# Patient Record
Sex: Female | Born: 1970
Health system: Southern US, Community
[De-identification: ages and names within clinical notes are randomized; demographics above are authoritative.]

## PROBLEM LIST (undated history)

## (undated) DIAGNOSIS — E119 Type 2 diabetes mellitus without complications: Secondary | ICD-10-CM

## (undated) DIAGNOSIS — I10 Essential (primary) hypertension: Secondary | ICD-10-CM

## (undated) DIAGNOSIS — G629 Polyneuropathy, unspecified: Secondary | ICD-10-CM

## (undated) HISTORY — PX: CERVICAL SPINE SURGERY: SHX589

---
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
## 2010-02-15 ENCOUNTER — Ambulatory Visit: Payer: Self-pay | Admitting: Internal Medicine

## 2010-03-17 ENCOUNTER — Inpatient Hospital Stay: Payer: Self-pay | Admitting: Internal Medicine

## 2010-03-17 IMAGING — US ABDOMEN ULTRASOUND
1 series · 17 of 25 positions shown · non-contrast
Comparison: none

REASON FOR EXAM: Low Platelets with Abd pain
COMMENTS:   May transport without cardiac monitor

[Series 1: abdomen ultrasound · 17 of 44 slices shown]
[im 1/44]
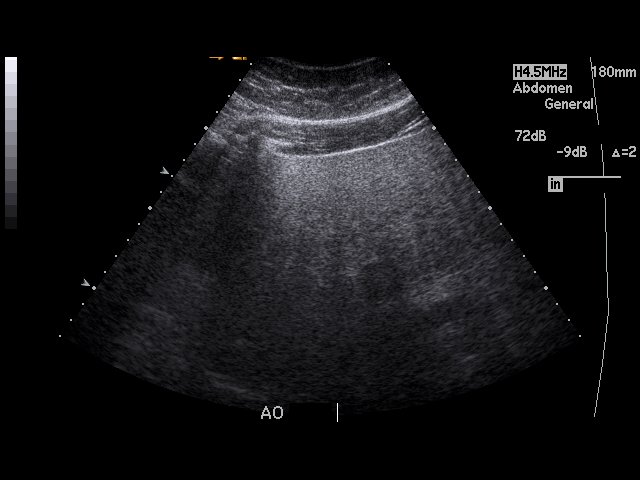
[im 4/44]
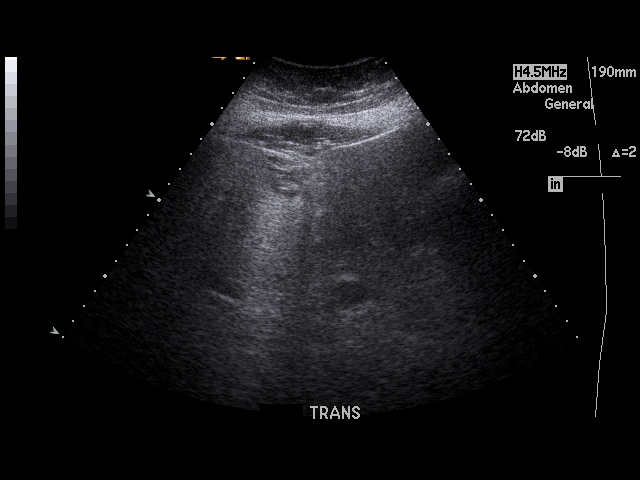
[im 6/44]
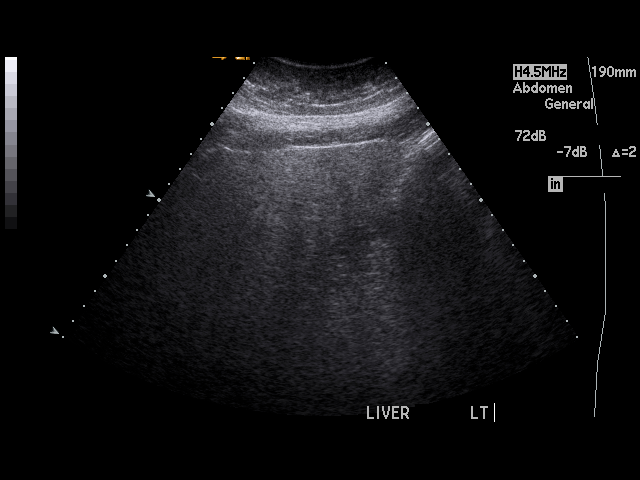
[im 9/44]
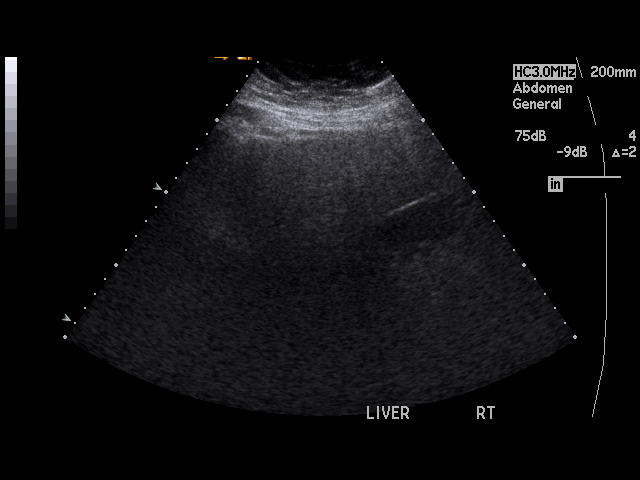
[im 11/44]
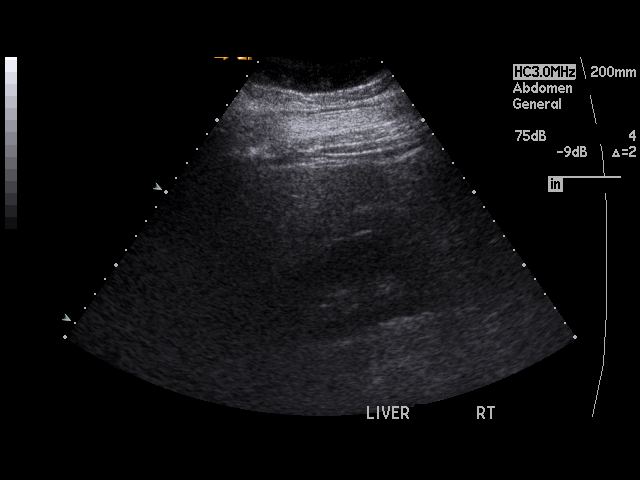
[im 15/44]
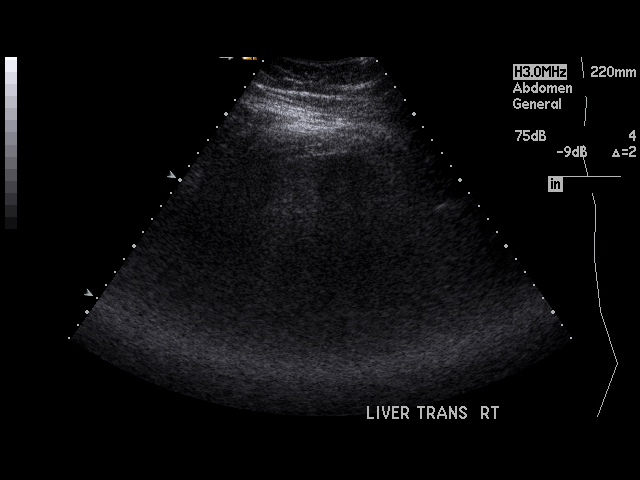
[im 17/44]
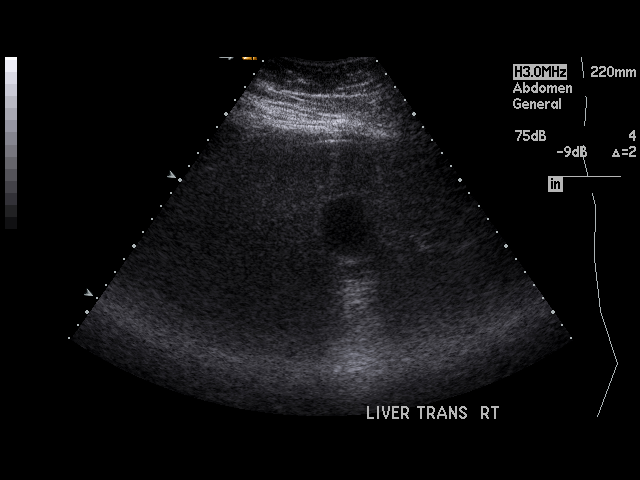
[im 20/44]
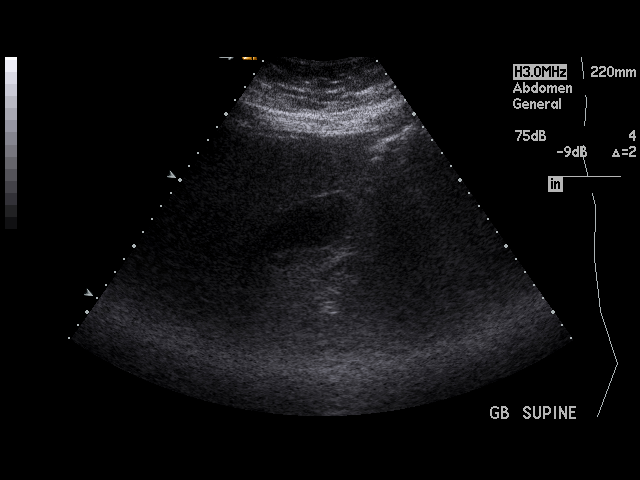
[im 22/44]
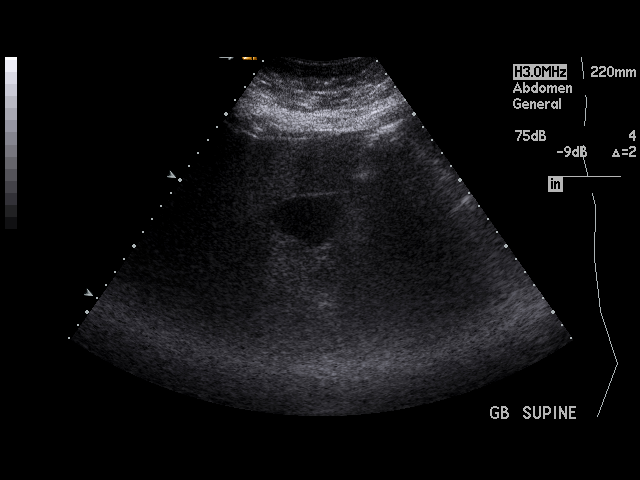
[im 24/44]
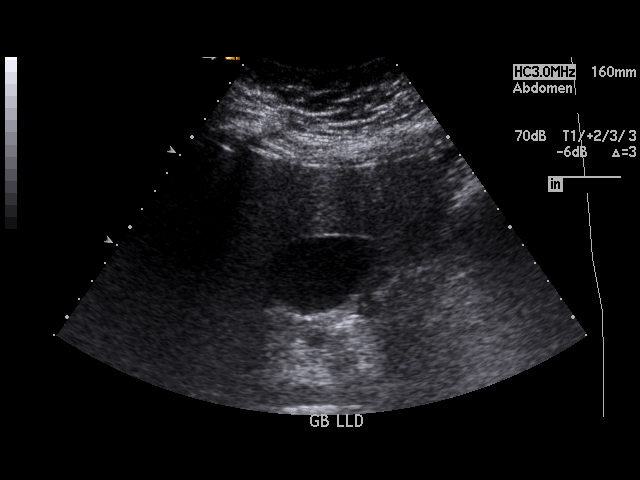
[im 27/44]
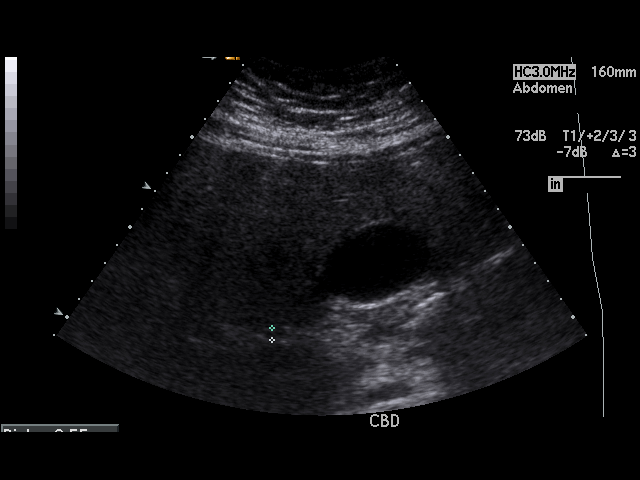
[im 29/44]
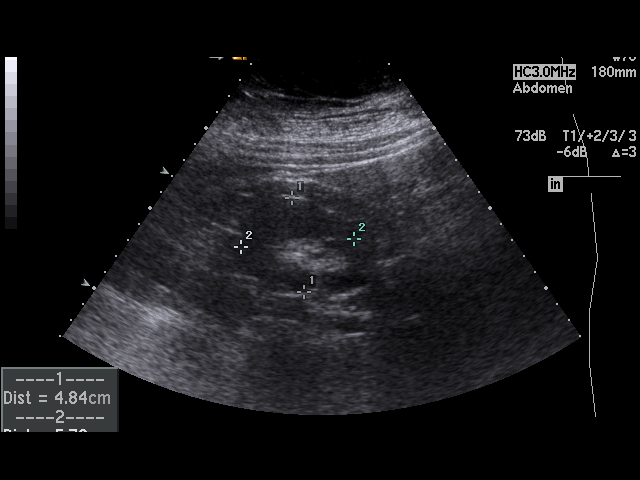
[im 33/44]
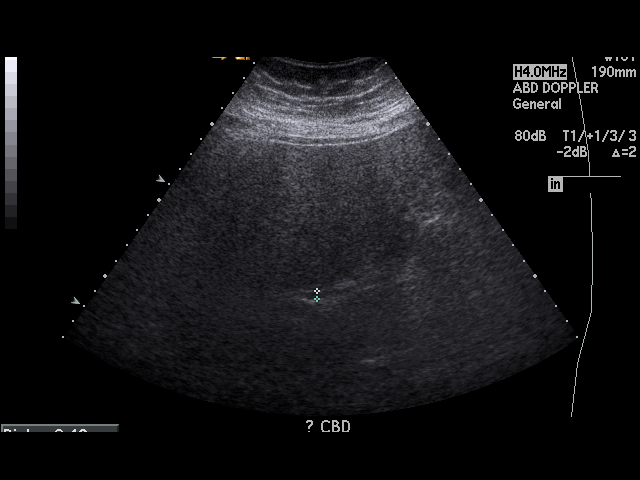
[im 35/44]
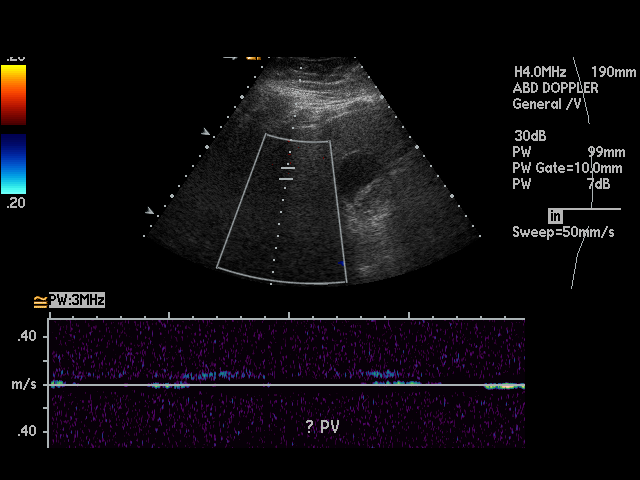
[im 38/44]
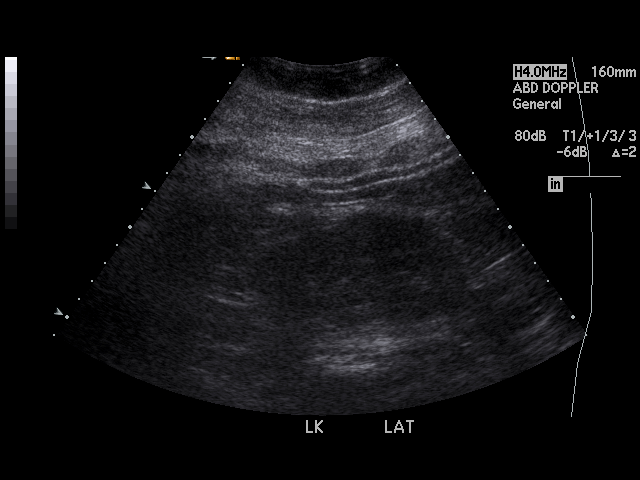
[im 40/44]
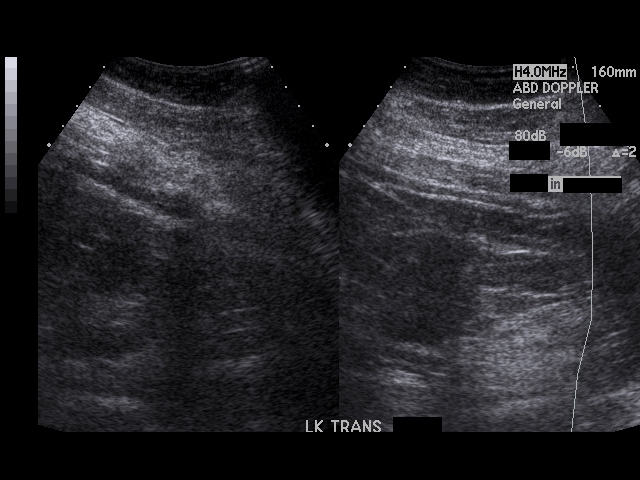
[im 44/44]
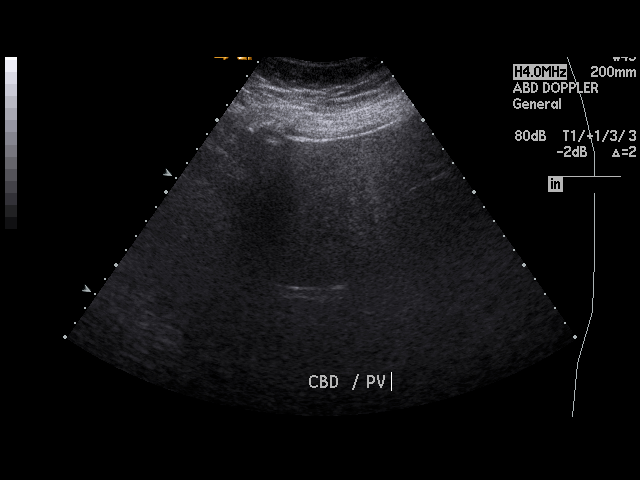

[17 of 25 positions shown; findings below may reference images not displayed]

PROCEDURE:     US  - US ABDOMEN GENERAL SURVEY  - [DATE]  [DATE]

RESULT:     Comparison: None.

Technique and Findings: Multiple grayscale and color Doppler images were
obtained of the abdomen.

The liver is diffusely echogenic and very dense, which likely represents
hepatic steatosis. The dense nature of the liver limits its evaluation. The
portal vein cannot be identified with grayscale or color Doppler flow. This
is likely related to the dense nature of the liver and patient body habitus.
However, as the portal vein is not identified, portal venous thrombosis
cannot be excluded.

The pancreas is not well visualized secondary to obscuration by overlying
bowel gas as well as the dense liver. The spleen is at the upper limits of
normal, measuring 12.5 cm in greatest dimension.

The gallbladder is normal. No gallstones. No pericholecystic fluid or
gallbladder wall thickening. The common bile duct measures 6 mm. The
sonographic Murphy sign is negative.

Images of the kidneys show no hydronephrosis.
IMPRESSION: 1. Extremely dense, hyperechoic liver, likely secondary to hepatic steatosis.
2. Normal gallbladder.
3. The portal vein was not able to be identified with grayscale or color
Doppler imaging. This is likely technical given the extremely dense nature
of the liver and patient body habitus. However, as the portal vein was
unable to be identified with grayscale or color Doppler imaging, portal vein
thrombosis cannot be excluded. If there is clinical concern for portal vein
thrombosis, further evaluation could be provided with contrast enhanced CT
or MRI.

## 2010-03-18 ENCOUNTER — Ambulatory Visit: Payer: Self-pay | Admitting: Internal Medicine

## 2010-03-18 IMAGING — CT CT ABD-PELV W/O CM
1 of 2 series · 15 of 32 positions shown, 19 images · non-contrast
Comparison: none

REASON FOR EXAM: (1) abd pain, pancreatitis; (2) abd pain pancreatitis,
recent possible staph inf
COMMENTS:   LMP: Negative Beta HCG

PROCEDURE:     CT  - CT ABDOMEN AND PELVIS W[DATE]  [DATE]
RESULT:     Comparison: None
TECHNIQUE: Multiple axial images from the lung bases to the symphysis pubis
were obtained with oral and without intravenous contrast.

[Series 2: 3mm soft tissue · axial · 0.94mm/px · z∈[-1040,-560]mm · 15 of 176 slices shown, 19 images]
[im 8/176  soft-tissue]
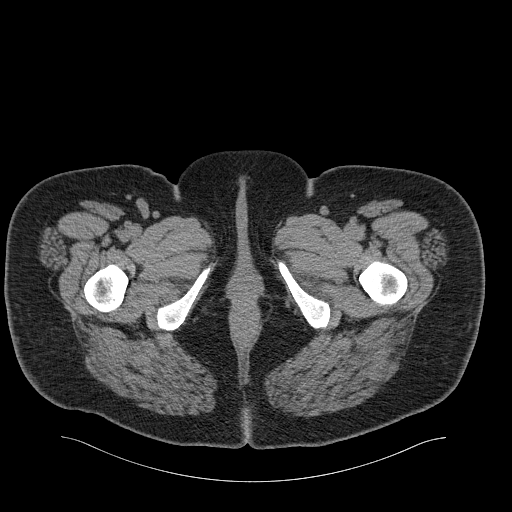
[im 8/176  bone]
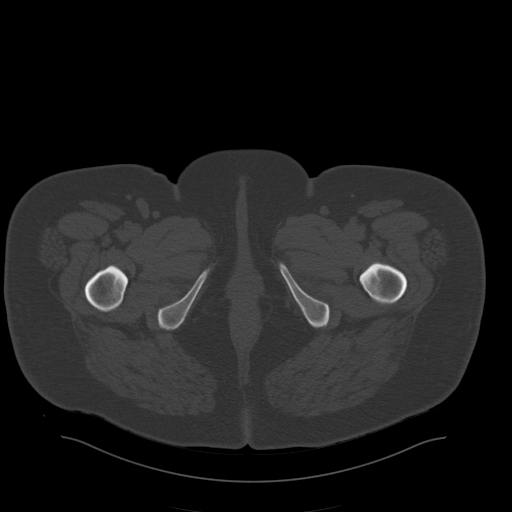
[im 22/176  soft-tissue]
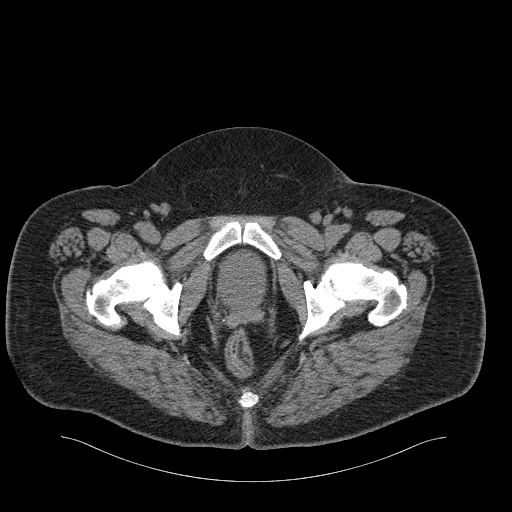
[im 37/176  soft-tissue]
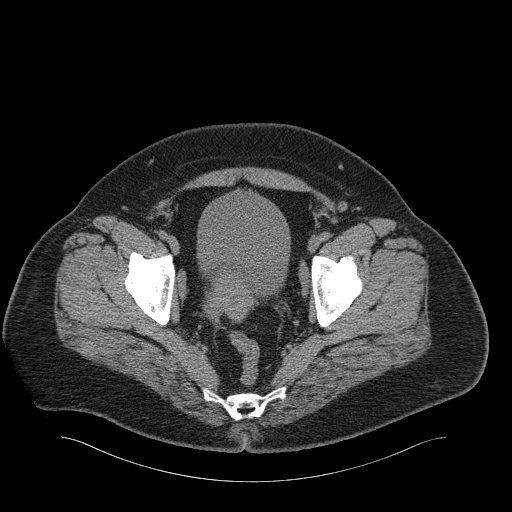
[im 52/176  soft-tissue]
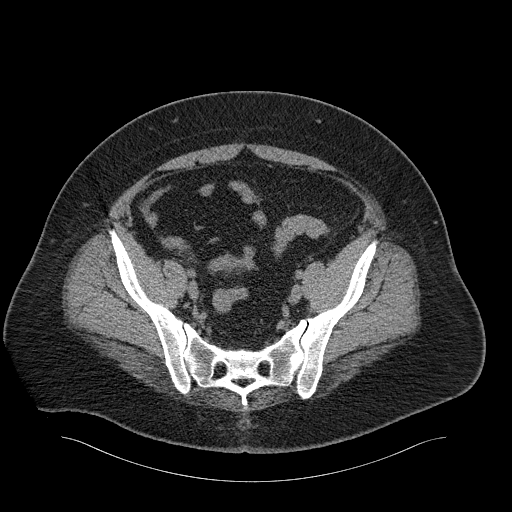
[im 59/176  soft-tissue]
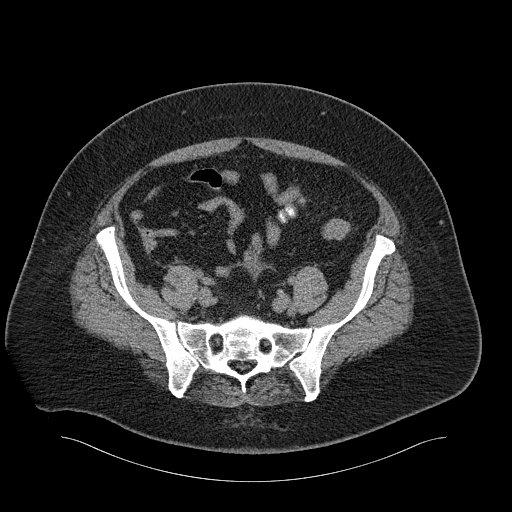
[im 73/176  soft-tissue]
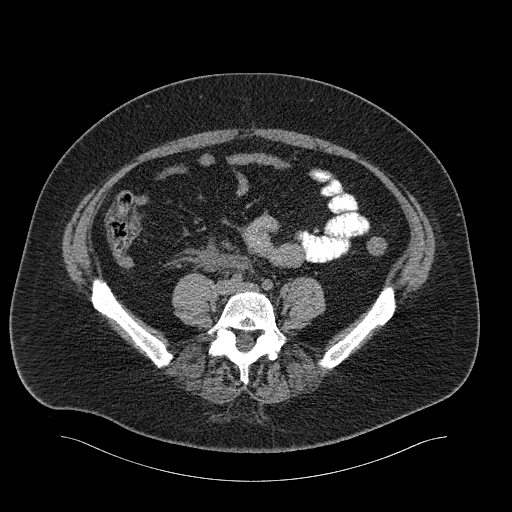
[im 88/176  soft-tissue]
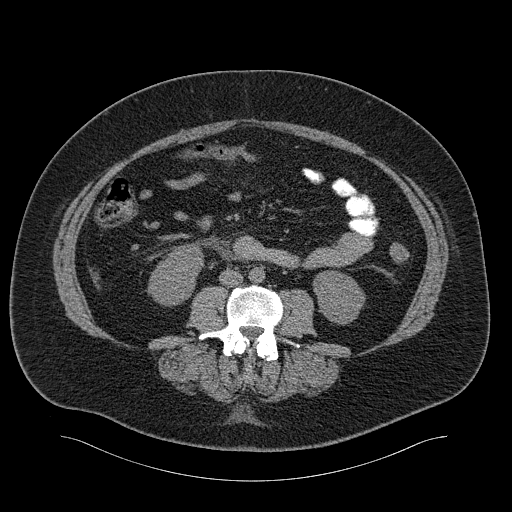
[im 103/176  soft-tissue]
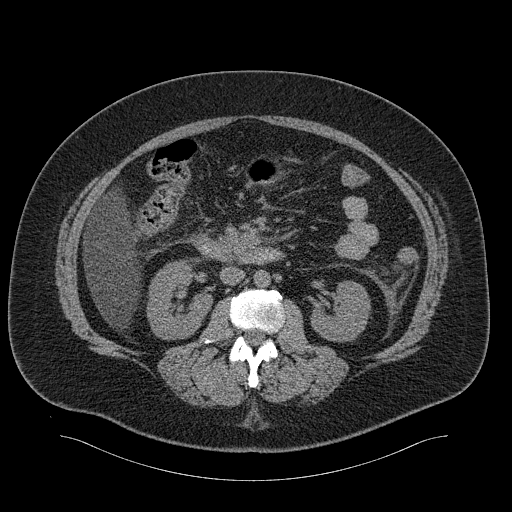
[im 117/176  soft-tissue]
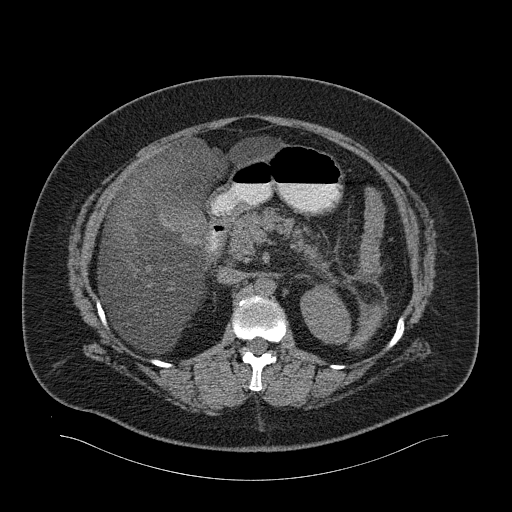
[im 117/176  bone]
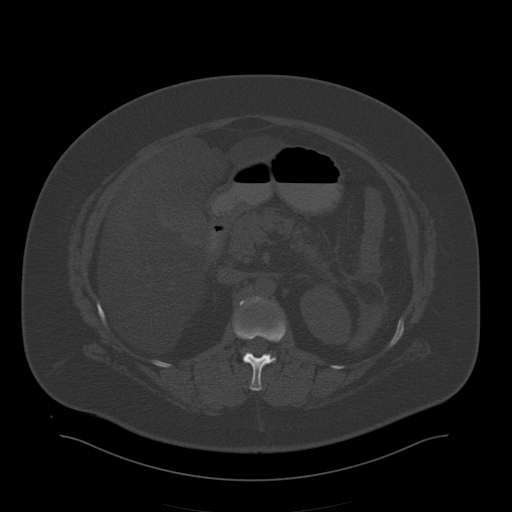
[im 124/176  soft-tissue]
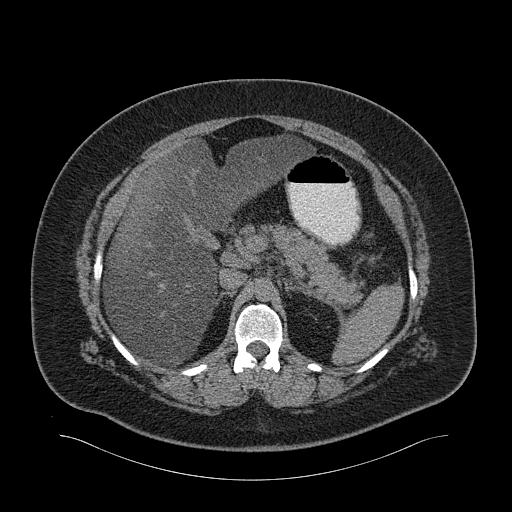
[im 139/176  soft-tissue]
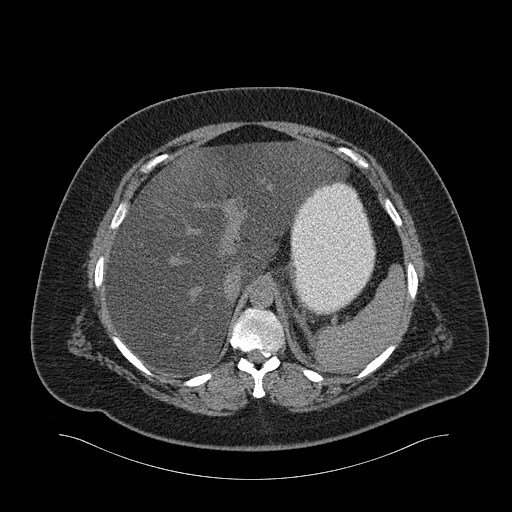
[im 146/176  lung]
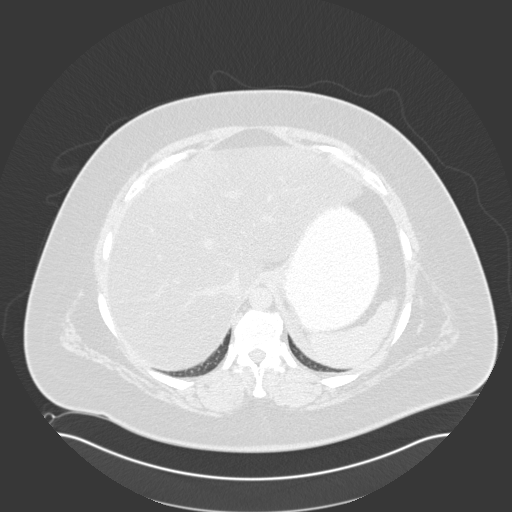
[im 154/176  soft-tissue]
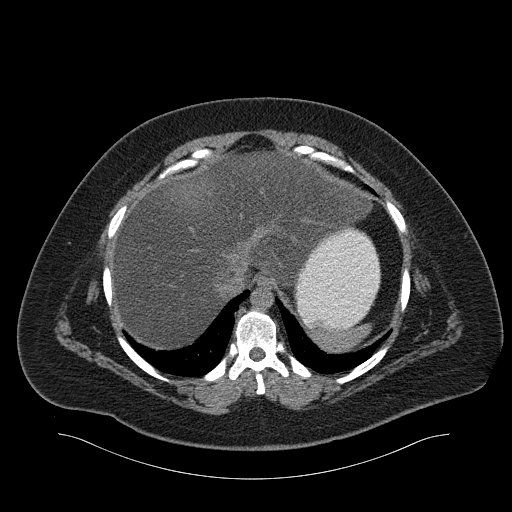
[im 154/176  lung]
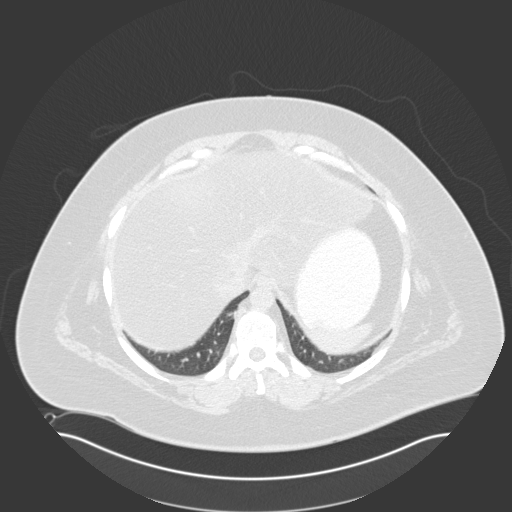
[im 161/176  lung]
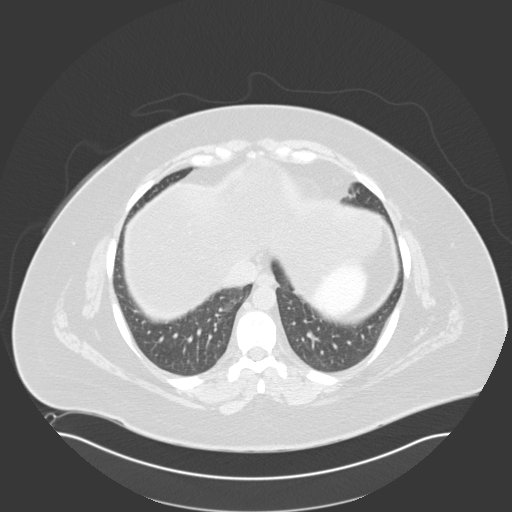
[im 168/176  soft-tissue]
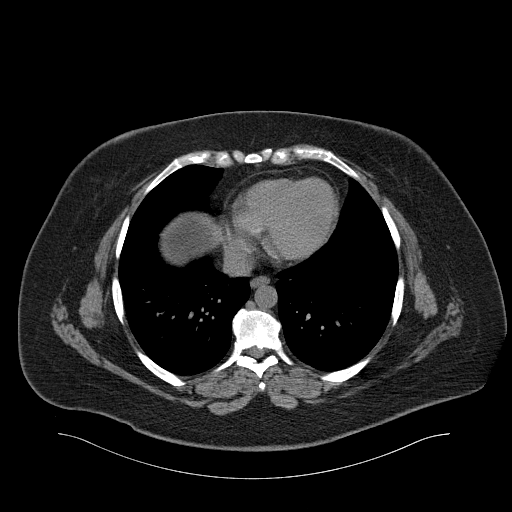
[im 168/176  lung]
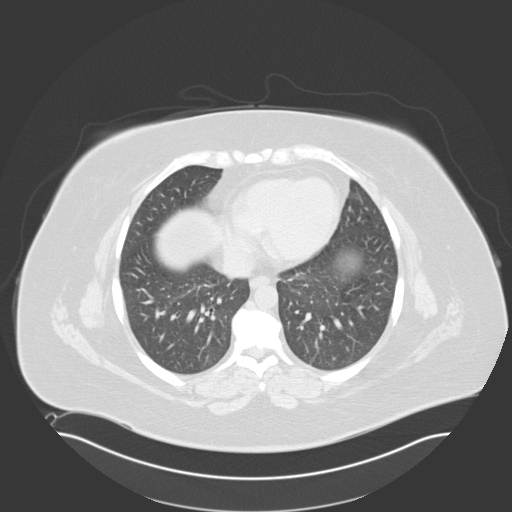

[15 of 32 positions shown; findings below may reference images not displayed]

FINDINGS: Lack of intravenous contrast limits evaluation of the solid abdominal
organs.  The liver is diffusely low in attenuation, consistent with hepatic
steatosis. Hyperdensity in the gallbladder may represent sludge. However, no
sludge demonstrated on the recent prior ultrasound.

The spleen, adrenals are unremarkable. There is mild stranding about the
pancreatic body and tail, with a small amount of fluid extending along the
left lateral conal fascia and inferior to the head of the pancreas. Findings
are consistent with acute pancreatitis. There are no well-defined fluid
collections. Evaluation for the enhancement of the pancreas cannot be made
secondary to lack of intravenous contrast. There are no focal areas of
hypoattenuation within the pancreas.

No hydronephrosis. No retroperitoneal or mesenteric lymphadenopathy. The
small and large bowel are normal in caliber. The appendix is normal.

No aggressive lytic or sclerotic osseous lesions identified.
IMPRESSION: 1. Acute pancreatitis. No well-defined fluid collections at this time.
2. Hepatic steatosis. There is suggestion of sludge within the gallbladder.
However, no sludge demonstrated on the recent prior ultrasound.

## 2010-03-22 IMAGING — CR DG CHEST 2V
1 series · 2 of 2 positions shown · non-contrast
Comparison: none

REASON FOR EXAM: hypoxia
COMMENTS:

[Series 1: view not recorded · 0.17mm/px · 2 of 2 slices shown]
[im 1/2]
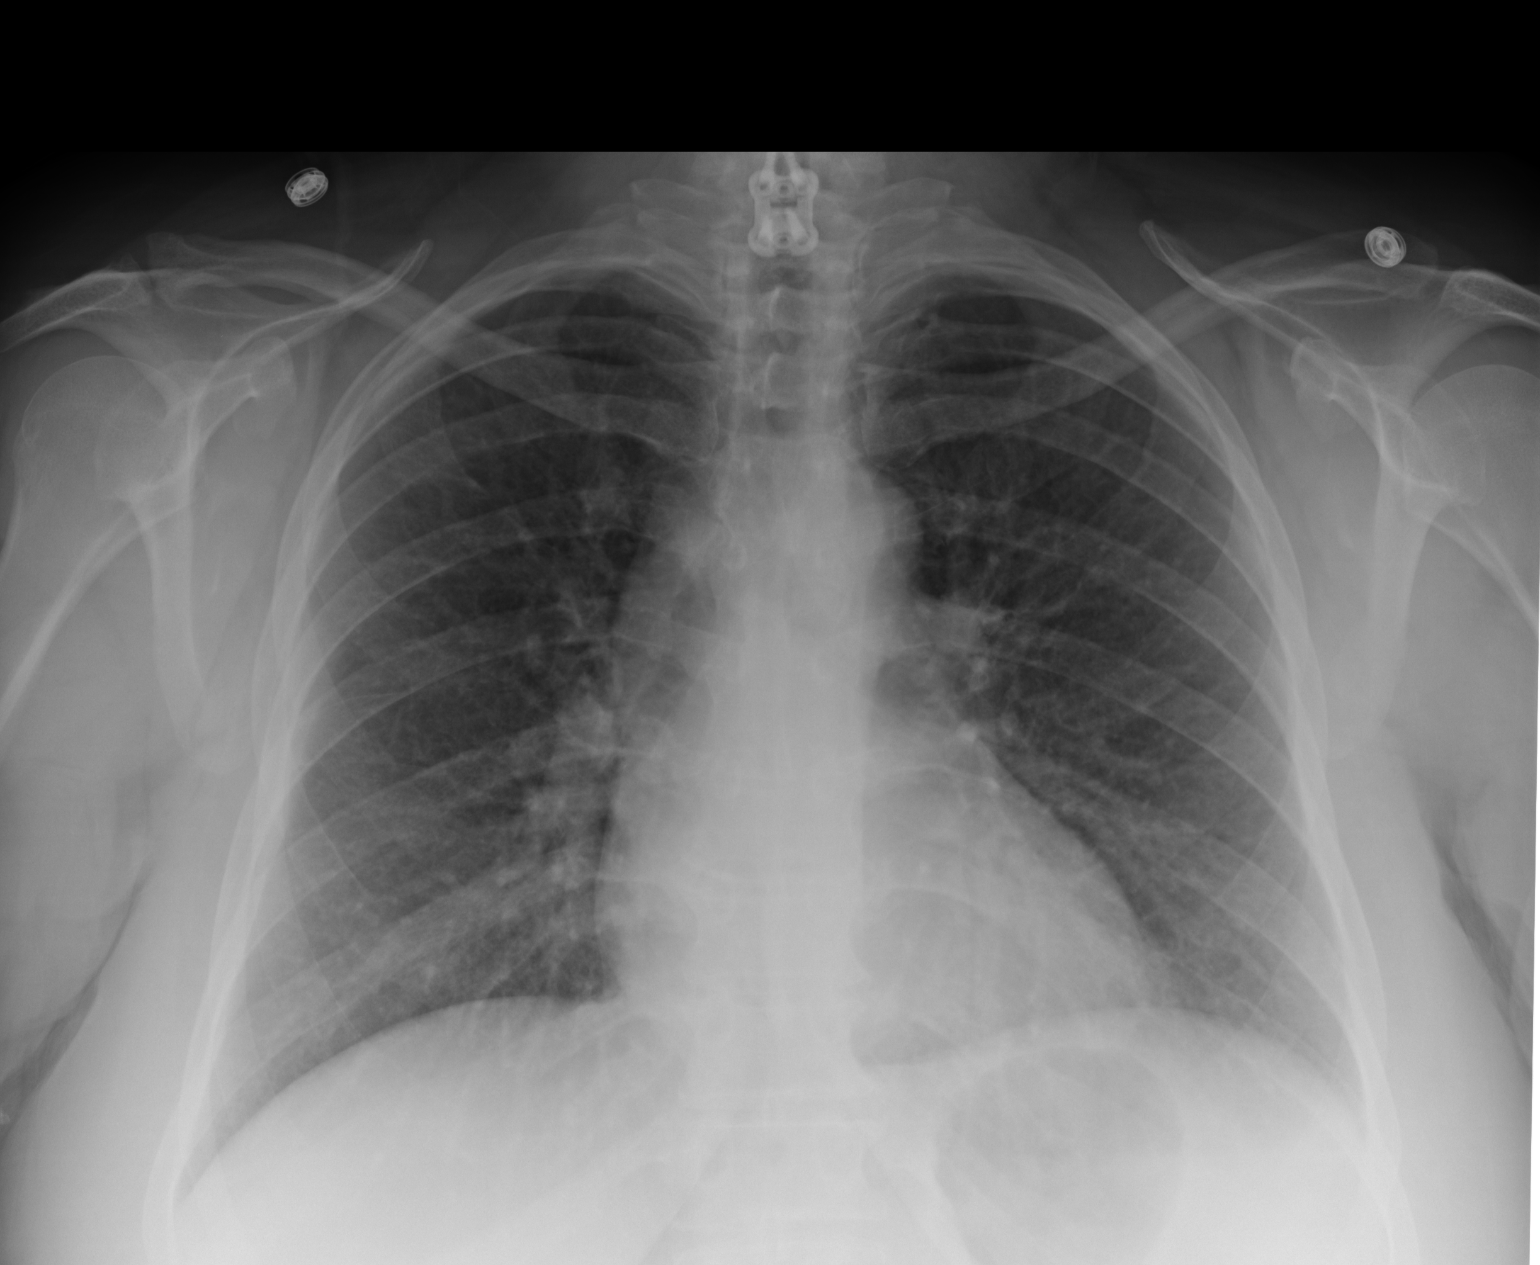
[im 2/2]
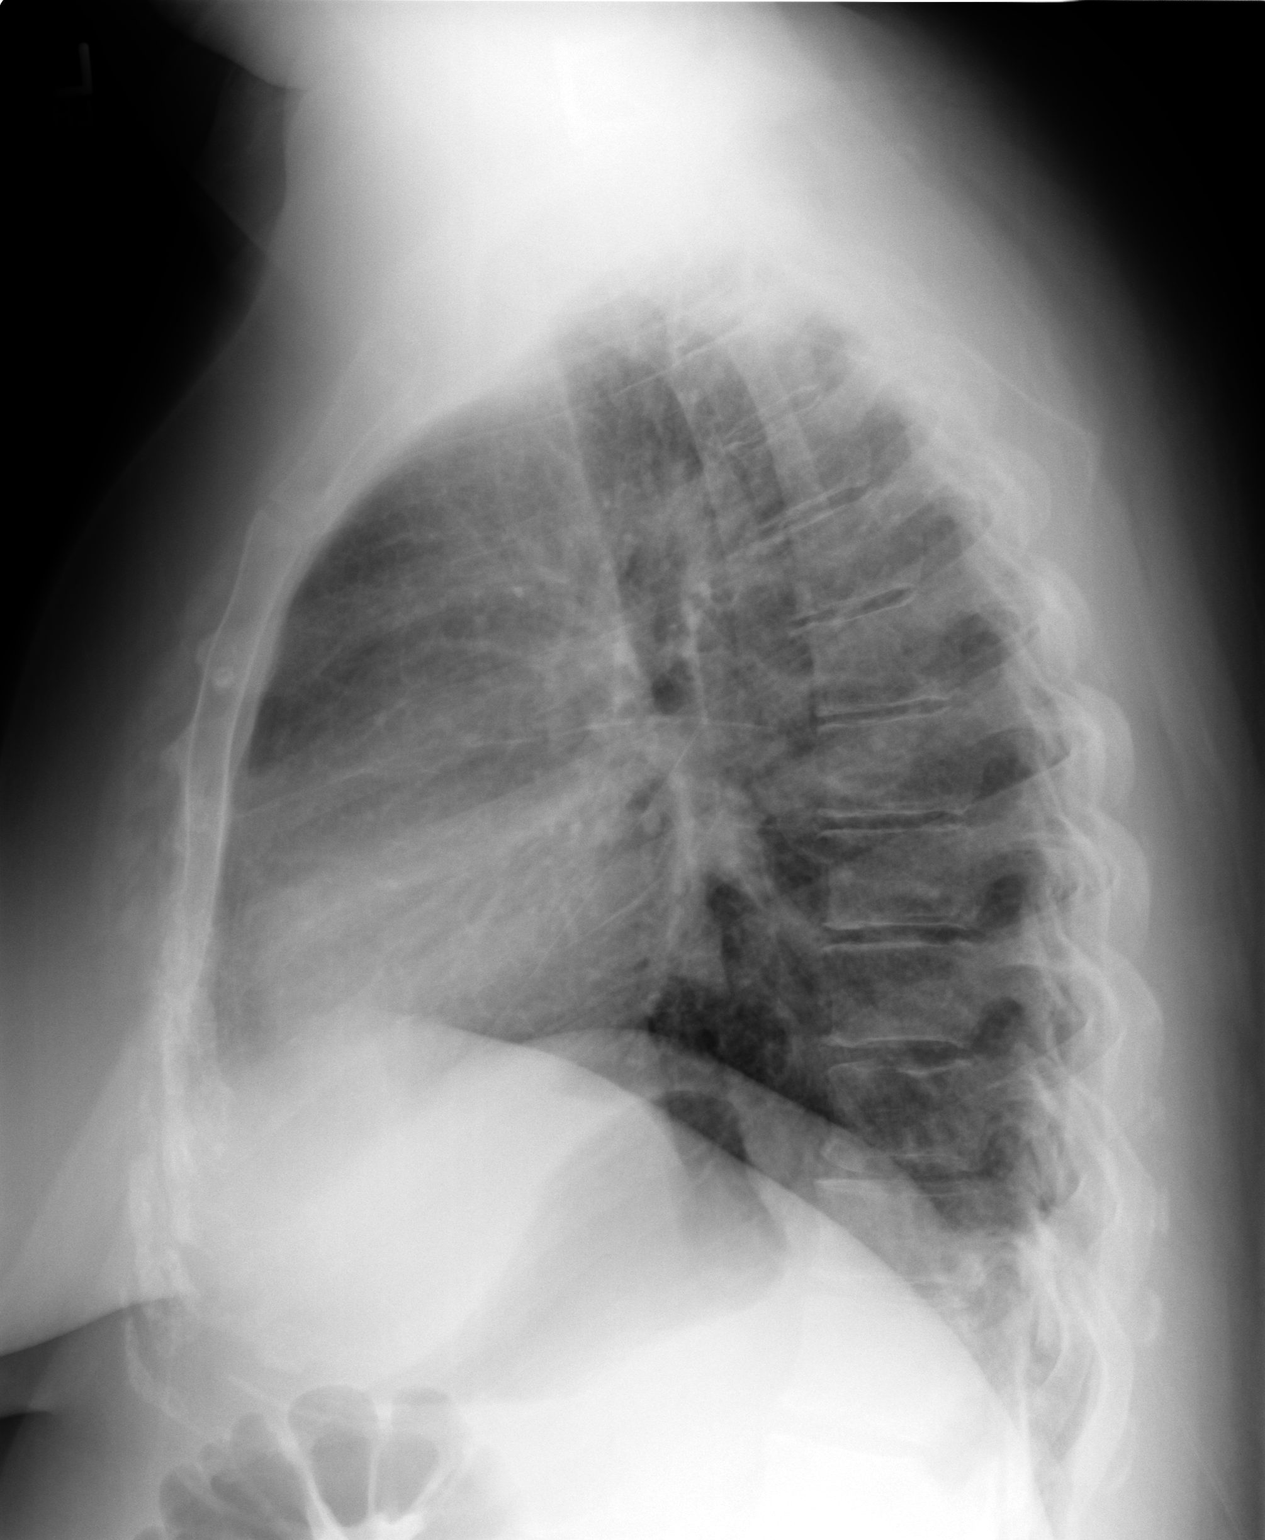

[2 of 2 positions shown; findings below may reference images not displayed]

PROCEDURE:     DXR - DXR CHEST PA (OR AP) AND LATERAL  - [DATE]  [DATE]

RESULT:     There is prominence of the interstitial markings without focal
regions of consolidation for focal infiltrates. The cardiac silhouette and
visualized bony skeleton is unremarkable. There is no evidence of
significant peribronchial cuffing.
IMPRESSION: Edematous versus nonedematous interstitial infiltrate. If an edematous
infiltrate is of diagnostic consideration, this appears be of noncardiogenic
origin.

## 2010-03-23 IMAGING — CT CT CHEST W/O CM
1 series · 16 of 31 positions shown, 20 images · non-contrast
Comparison: none

REASON FOR EXAM: pneumonia?
COMMENTS:

PROCEDURE:     CT  - CT CHEST WITHOUT CONTRAST  - [DATE] [DATE]
RESULT:
HISTORY: Pneumonia.

[Series 2: soft tissue · axial · 0.75mm/px · z∈[-695,-430]mm · 16 of 59 slices shown, 20 images]
[im 3/59  mediastinal]
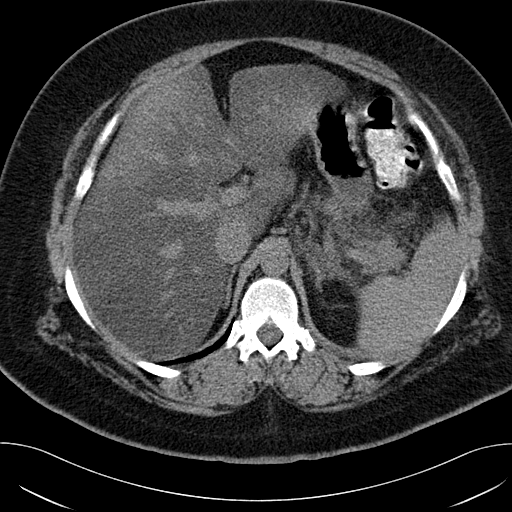
[im 3/59  lung]
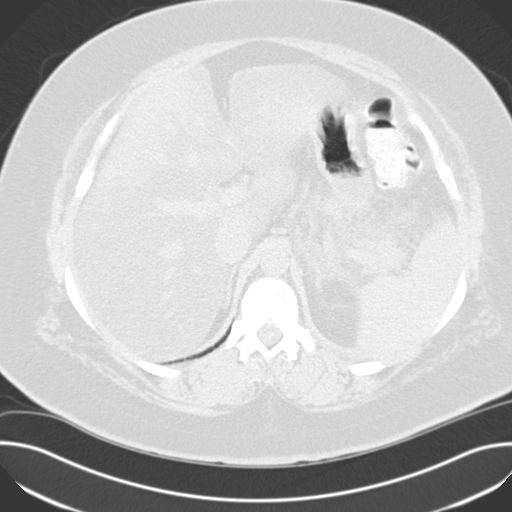
[im 7/59  lung]
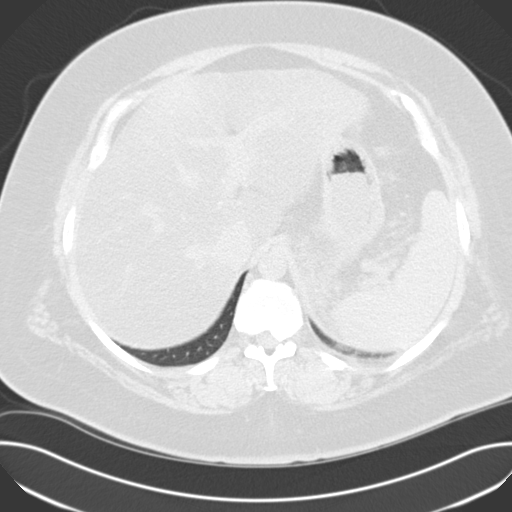
[im 11/59  lung]
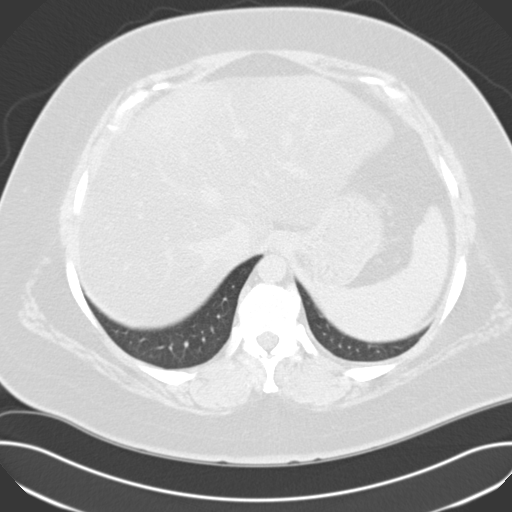
[im 13/59  lung]
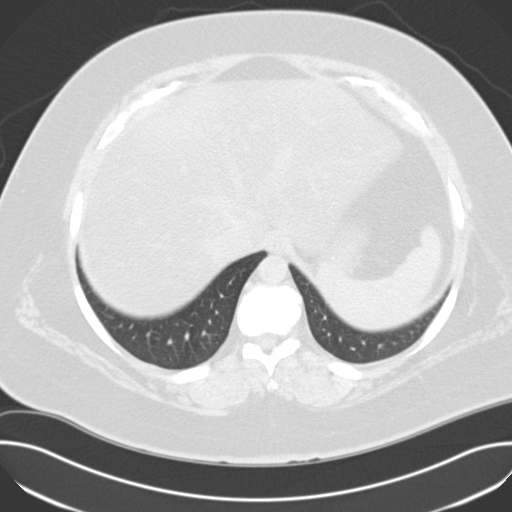
[im 18/59  mediastinal]
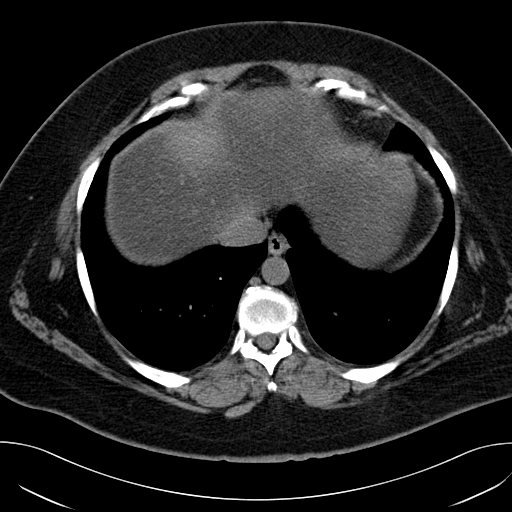
[im 18/59  lung]
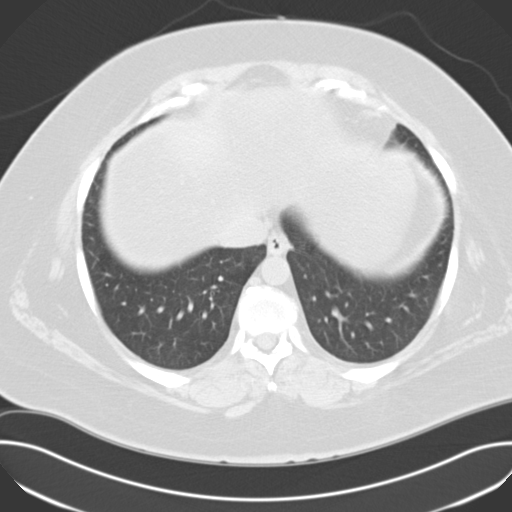
[im 20/59  lung]
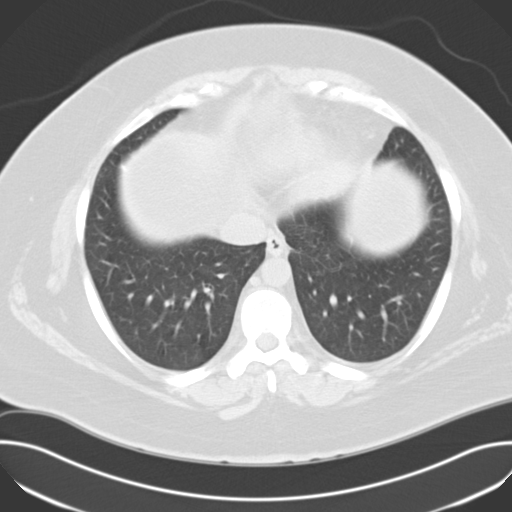
[im 24/59  lung]
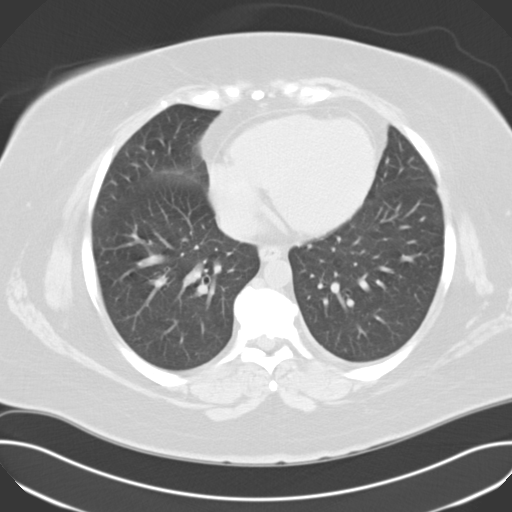
[im 28/59  lung]
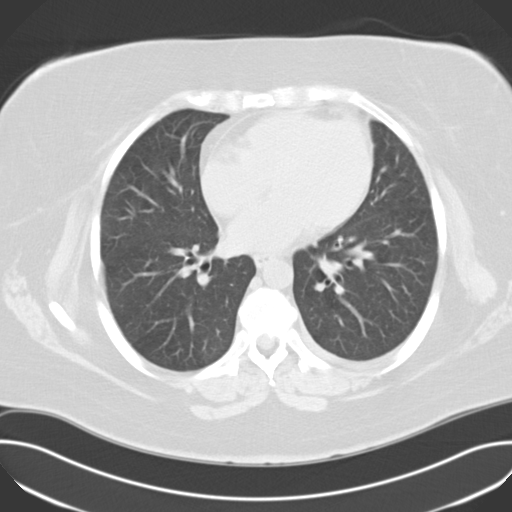
[im 32/59  mediastinal]
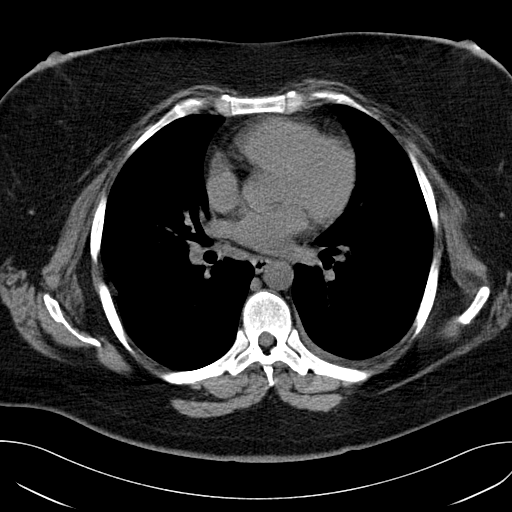
[im 32/59  lung]
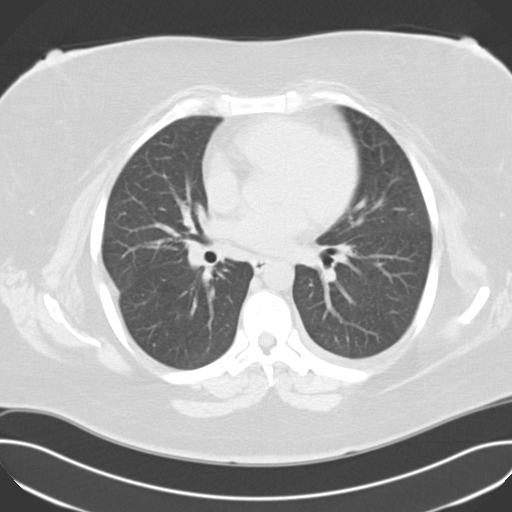
[im 35/59  lung]
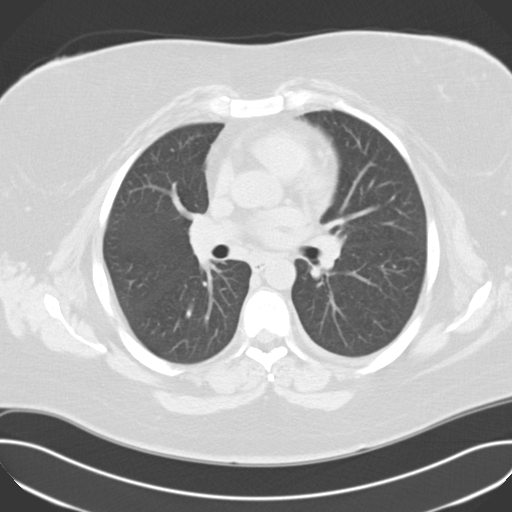
[im 39/59  lung]
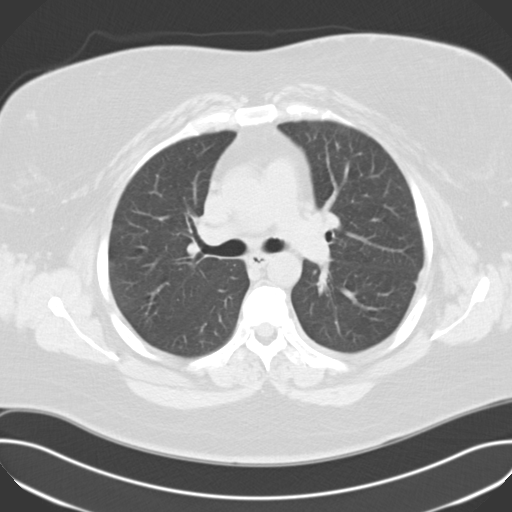
[im 41/59  lung]
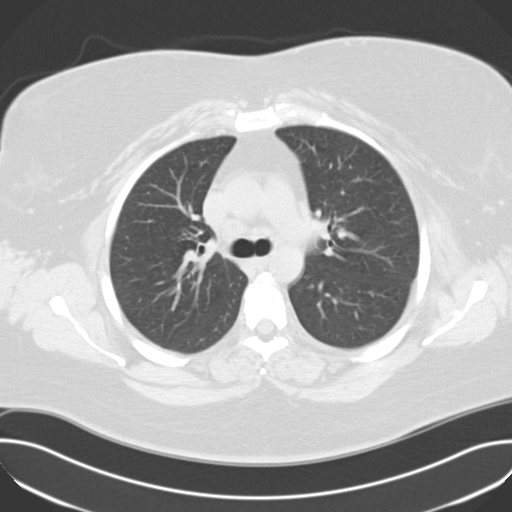
[im 46/59  mediastinal]
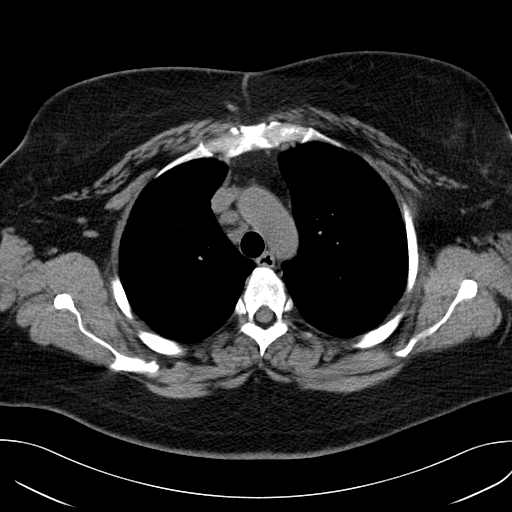
[im 46/59  lung]
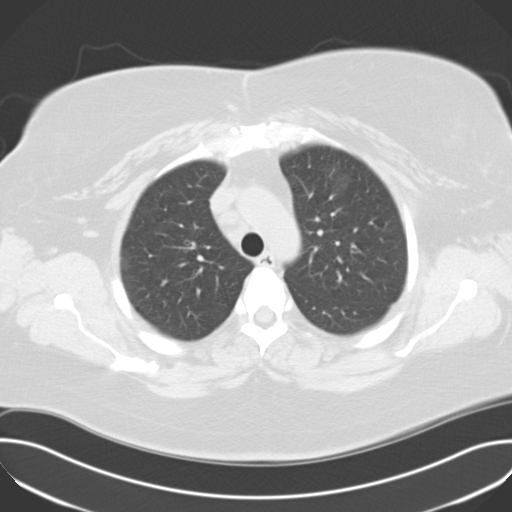
[im 48/59  lung]
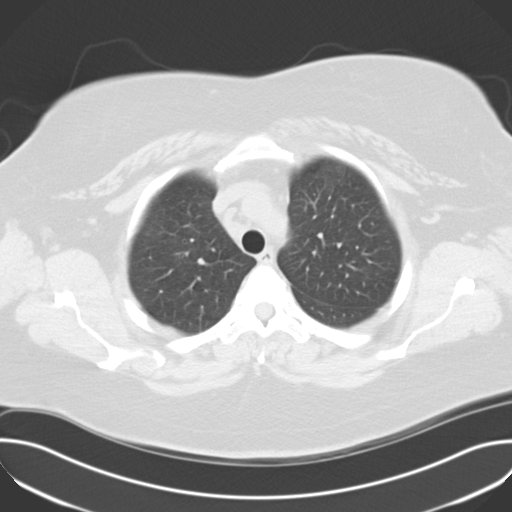
[im 52/59  lung]
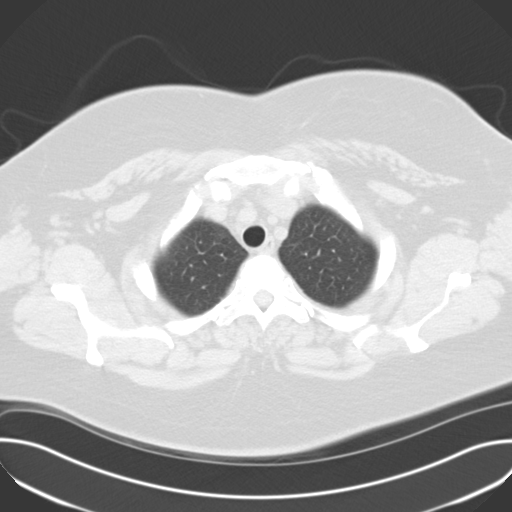
[im 56/59  lung]
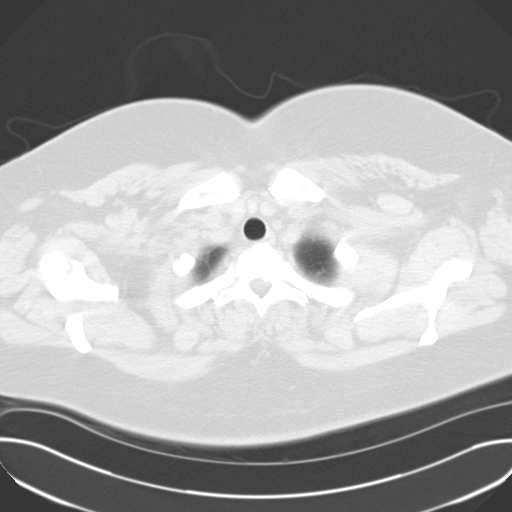

[16 of 31 positions shown; findings below may reference images not displayed]

FINDINGS: Standard CT was obtained and compared to prior study [DATE].
Large airways are patent. No focal pulmonary lesions are identified. No
evidence of infiltrate. Lung CAD reveals no focal abnormality. Shotty
mediastinal and hilar lymph nodes are present. Heart size is normal. Fatty
replacement of the liver is noted. Extensive edema is noted throughout the
visualized portions of the pancreas. Pancreatitis can present in this
fashion. Edema appears to have progressed from prior study of [DATE].
IMPRESSION: 1. No evidence of pneumonia. No focal pulmonary infiltrates.
2. Pancreatic edema appears to have progressed from [DATE].
3. Hepatic steatosis.

## 2010-03-26 ENCOUNTER — Ambulatory Visit: Payer: Self-pay | Admitting: Internal Medicine

## 2010-04-03 ENCOUNTER — Ambulatory Visit: Payer: Self-pay | Admitting: Internal Medicine

## 2010-04-16 ENCOUNTER — Ambulatory Visit: Payer: Self-pay

## 2010-04-18 ENCOUNTER — Ambulatory Visit: Payer: Self-pay | Admitting: Internal Medicine

## 2010-04-18 ENCOUNTER — Ambulatory Visit: Payer: Self-pay

## 2010-05-17 ENCOUNTER — Ambulatory Visit: Payer: Self-pay | Admitting: Internal Medicine

## 2010-05-17 ENCOUNTER — Ambulatory Visit: Payer: Self-pay

## 2010-06-03 ENCOUNTER — Other Ambulatory Visit: Payer: Self-pay | Admitting: Gastroenterology

## 2010-06-04 ENCOUNTER — Ambulatory Visit: Payer: Self-pay | Admitting: Gastroenterology

## 2010-06-17 ENCOUNTER — Ambulatory Visit: Payer: Self-pay | Admitting: Internal Medicine

## 2010-06-17 ENCOUNTER — Ambulatory Visit: Payer: Self-pay

## 2010-07-17 ENCOUNTER — Ambulatory Visit: Payer: Self-pay | Admitting: Internal Medicine

## 2010-08-17 ENCOUNTER — Ambulatory Visit: Payer: Self-pay | Admitting: Internal Medicine

## 2010-09-16 ENCOUNTER — Ambulatory Visit: Payer: Self-pay | Admitting: Internal Medicine

## 2010-10-17 ENCOUNTER — Ambulatory Visit: Payer: Self-pay | Admitting: Internal Medicine

## 2010-11-17 ENCOUNTER — Ambulatory Visit: Payer: Self-pay | Admitting: Internal Medicine

## 2010-12-17 ENCOUNTER — Ambulatory Visit: Payer: Self-pay | Admitting: Internal Medicine

## 2011-01-17 ENCOUNTER — Ambulatory Visit: Payer: Self-pay | Admitting: Internal Medicine

## 2011-03-15 ENCOUNTER — Ambulatory Visit: Payer: Self-pay | Admitting: Internal Medicine

## 2011-03-19 ENCOUNTER — Ambulatory Visit: Payer: Self-pay | Admitting: Internal Medicine

## 2011-05-13 ENCOUNTER — Ambulatory Visit: Payer: Self-pay | Admitting: Internal Medicine

## 2011-05-13 LAB — CBC CANCER CENTER
Basophil #: 0 x10 3/mm (ref 0.0–0.1)
Eosinophil #: 0.1 x10 3/mm (ref 0.0–0.7)
HCT: 37.7 % (ref 35.0–47.0)
Lymphocyte #: 2.7 x10 3/mm (ref 1.0–3.6)
MCH: 31.4 pg (ref 26.0–34.0)
MCHC: 34.3 g/dL (ref 32.0–36.0)
MCV: 92 fL (ref 80–100)
Monocyte #: 0.4 x10 3/mm (ref 0.0–0.7)
Monocyte %: 4.3 %
Neutrophil #: 6.9 x10 3/mm — ABNORMAL HIGH (ref 1.4–6.5)
Platelet: 126 x10 3/mm — ABNORMAL LOW (ref 150–440)
RDW: 14.1 % (ref 11.5–14.5)
WBC: 10.2 x10 3/mm (ref 3.6–11.0)

## 2011-05-17 ENCOUNTER — Ambulatory Visit: Payer: Self-pay | Admitting: Internal Medicine

## 2011-05-28 ENCOUNTER — Ambulatory Visit: Payer: Self-pay | Admitting: Internal Medicine

## 2011-05-28 IMAGING — MG MAM DGTL SCRN MAM NO ORDER W/CAD
1 series · 5 of 5 positions shown · non-contrast
Comparison: none

REASON FOR EXAM: SCR MAMMO NO ORDER
COMMENTS:

PROCEDURE:     MAM - MAM DGTL SCRN MAM NO ORDER W/CAD  - [DATE]  [DATE]
RESULT:
Comparison is made to prior examinations [DATE] and [DATE].
The breast parenchyma bilaterally is nearly all fatty. No dominant mass or
malignant appearing microcalcifications are seen.

[Series 2268: R CC · right · 5 of 5 slices shown]
[im 1/5]
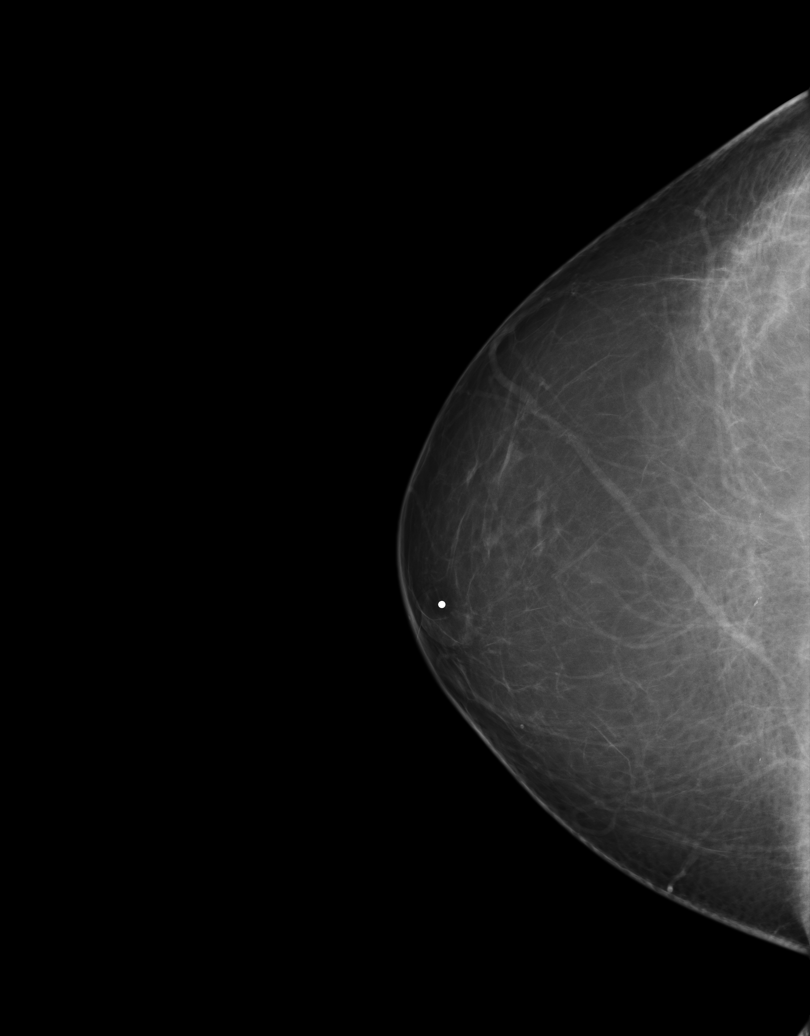
[im 2/5]
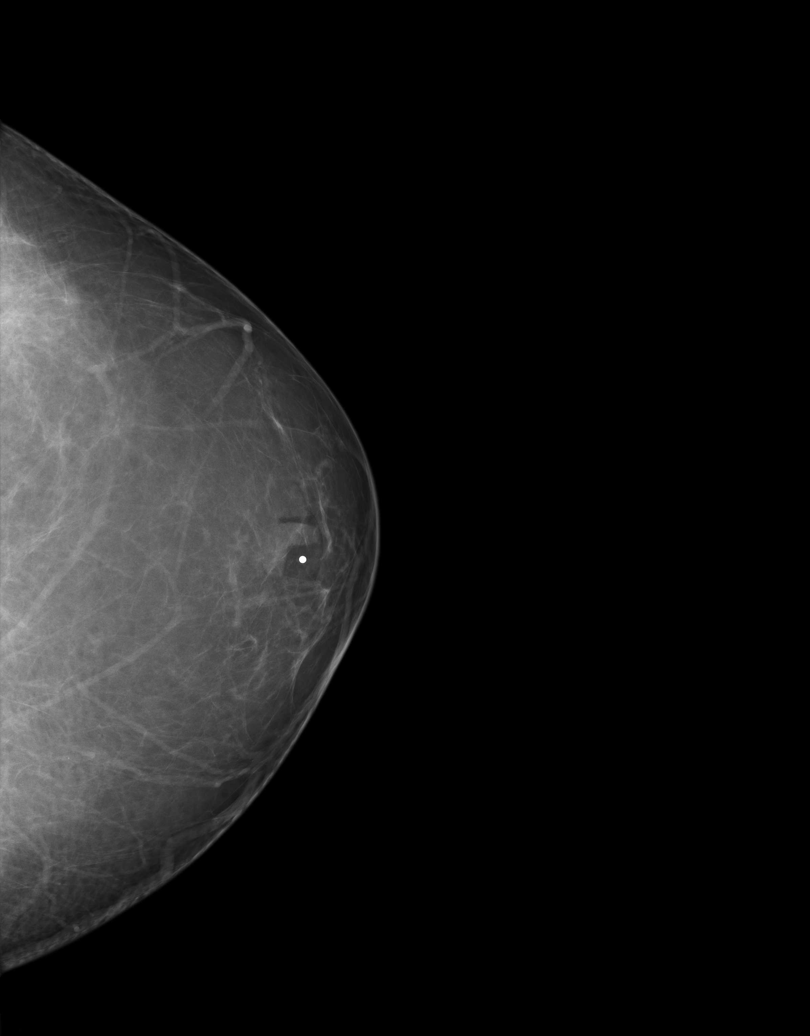
[im 3/5]
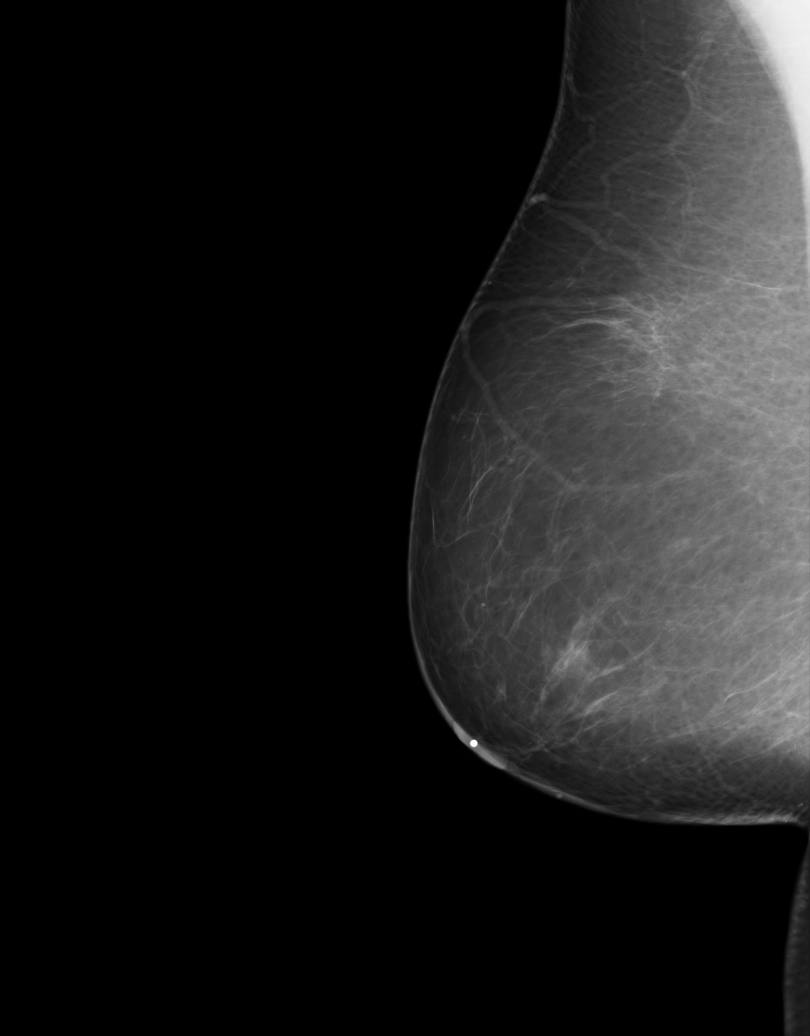
[im 4/5]
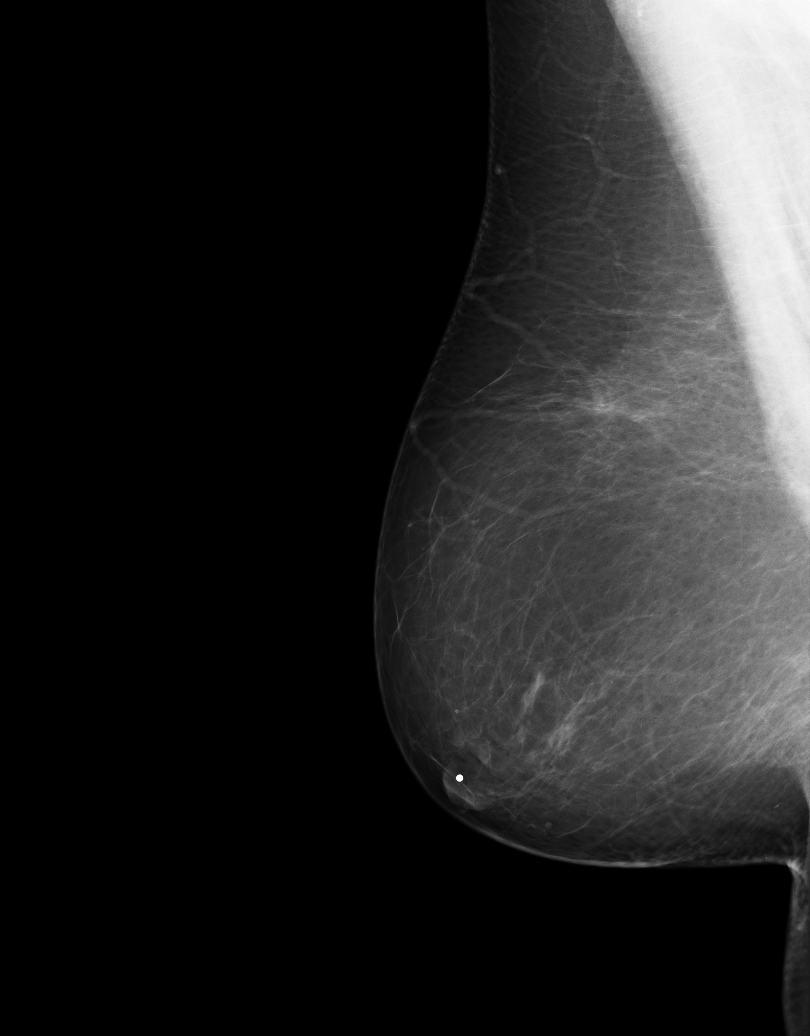
[im 5/5]
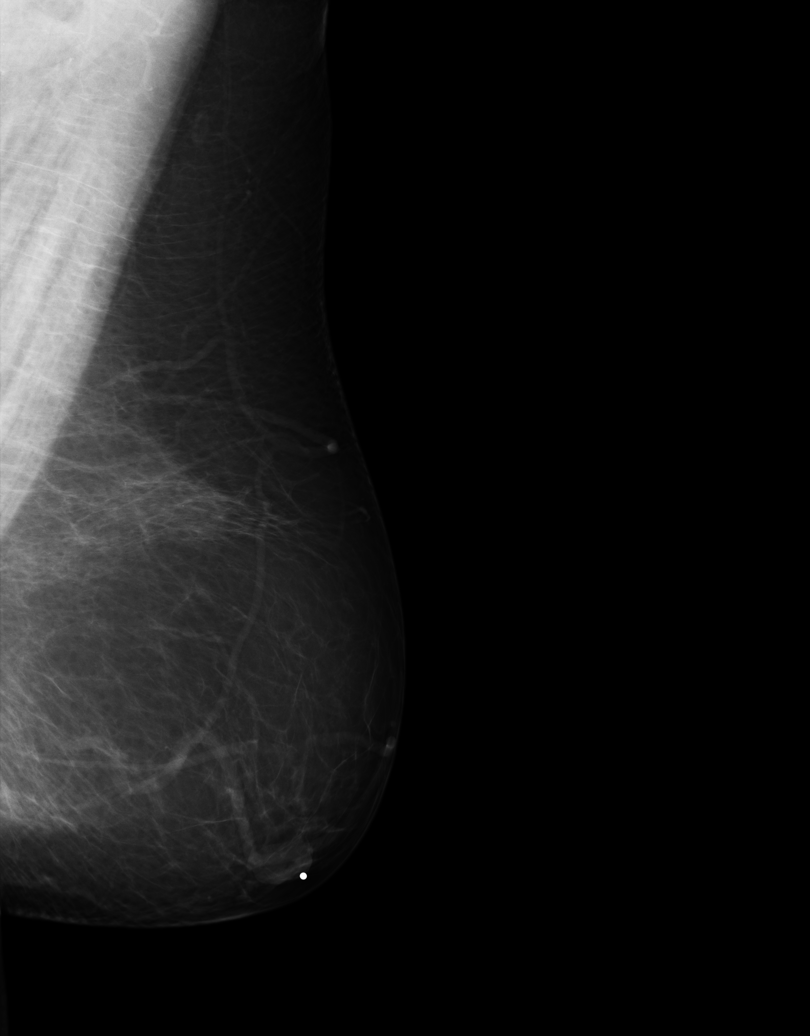

[5 of 5 positions shown; findings below may reference images not displayed]

IMPRESSION: 1.  Bilaterally benign appearing screening mammography.
2.  Continued annual screening mammography is recommended.
3.  BI-RADS: Category 1 Negative.

Thank you for this opportunity to contribute to the care of your patient.

A NEGATIVE MAMMOGRAM REPORT DOES NOT PRECLUDE BIOPSY OR OTHER EVALUATION OF
A CLINICALLY PALPABLE OR OTHERWISE SUSPICIOUS MASS OR LESION. BREAST CANCER
MAY NOT BE DETECTED BY MAMMOGRAPHY IN UP TO 10% OF CASES.

## 2011-08-15 ENCOUNTER — Ambulatory Visit: Payer: Self-pay | Admitting: Internal Medicine

## 2011-08-15 LAB — CBC CANCER CENTER
Basophil #: 0 x10 3/mm (ref 0.0–0.1)
Basophil %: 0.4 %
Eosinophil #: 0.2 x10 3/mm (ref 0.0–0.7)
Eosinophil %: 2.5 %
Lymphocyte #: 2.3 x10 3/mm (ref 1.0–3.6)
Lymphocyte %: 26.2 %
MCH: 30.9 pg (ref 26.0–34.0)
MCHC: 33 g/dL (ref 32.0–36.0)
Monocyte %: 5.9 %
Neutrophil #: 5.8 x10 3/mm (ref 1.4–6.5)
Neutrophil %: 65 %
RBC: 4.12 10*6/uL (ref 3.80–5.20)

## 2011-08-17 ENCOUNTER — Ambulatory Visit: Payer: Self-pay | Admitting: Internal Medicine

## 2011-08-29 ENCOUNTER — Ambulatory Visit: Payer: Self-pay | Admitting: Obstetrics and Gynecology

## 2011-08-29 LAB — CBC
HCT: 37 % (ref 35.0–47.0)
HGB: 12.5 g/dL (ref 12.0–16.0)
MCH: 31.6 pg (ref 26.0–34.0)
MCHC: 33.8 g/dL (ref 32.0–36.0)
Platelet: 111 10*3/uL — ABNORMAL LOW (ref 150–440)
RBC: 3.96 10*6/uL (ref 3.80–5.20)
RDW: 14.4 % (ref 11.5–14.5)
WBC: 9.3 10*3/uL (ref 3.6–11.0)

## 2011-08-29 LAB — BASIC METABOLIC PANEL
Anion Gap: 11 (ref 7–16)
BUN: 13 mg/dL (ref 7–18)
Chloride: 107 mmol/L (ref 98–107)
Creatinine: 1.22 mg/dL (ref 0.60–1.30)
EGFR (Non-African Amer.): 55 — ABNORMAL LOW
Sodium: 139 mmol/L (ref 136–145)

## 2011-08-29 LAB — URINALYSIS, COMPLETE
Bilirubin,UR: NEGATIVE
Glucose,UR: NEGATIVE mg/dL (ref 0–75)
Hyaline Cast: 3
RBC,UR: 1 /HPF (ref 0–5)
Specific Gravity: 1.016 (ref 1.003–1.030)
WBC UR: 2 /HPF (ref 0–5)

## 2011-08-29 LAB — PLATELET FUNCTION ASSAY: COL/EPI PLT FXN SCRN: 107 Seconds

## 2011-08-29 LAB — PREGNANCY, URINE: Pregnancy Test, Urine: NEGATIVE m[IU]/mL

## 2011-08-30 ENCOUNTER — Ambulatory Visit: Payer: Self-pay | Admitting: Obstetrics and Gynecology

## 2011-08-31 LAB — CBC WITH DIFFERENTIAL/PLATELET
Basophil #: 0 x10 3/mm 3
Basophil %: 0.5 %
Eosinophil #: 0.2 x10 3/mm 3
Eosinophil %: 2.6 %
HCT: 32.9 % — ABNORMAL LOW
HGB: 11 g/dL — ABNORMAL LOW
Lymphocyte %: 30.6 %
Lymphs Abs: 2.3 x10 3/mm 3
MCH: 31.5 pg
MCHC: 33.4 g/dL
MCV: 94 fL
Monocyte #: 0.5 "x10 3/mm "
Monocyte %: 7 %
Neutrophil #: 4.5 x10 3/mm 3
Neutrophil %: 59.3 %
Platelet: 85 x10 3/mm 3 — ABNORMAL LOW
RBC: 3.49 X10 6/mm 3 — ABNORMAL LOW
RDW: 14.7 % — ABNORMAL HIGH
WBC: 7.6 x10 3/mm 3

## 2012-03-18 HISTORY — PX: LAPAROSCOPIC SUPRACERVICAL HYSTERECTOMY: SUR797

## 2012-05-16 ENCOUNTER — Ambulatory Visit: Payer: Self-pay | Admitting: Internal Medicine

## 2012-06-19 ENCOUNTER — Ambulatory Visit: Payer: Self-pay | Admitting: Oncology

## 2012-06-22 LAB — CBC CANCER CENTER
Basophil #: 0.1 x10 3/mm (ref 0.0–0.1)
Basophil %: 1.1 %
Eosinophil %: 1.1 %
HGB: 13.2 g/dL (ref 12.0–16.0)
Lymphocyte #: 3.1 x10 3/mm (ref 1.0–3.6)
Lymphocyte %: 29.9 %
MCH: 29.6 pg (ref 26.0–34.0)
MCHC: 33.3 g/dL (ref 32.0–36.0)
MCV: 89 fL (ref 80–100)
Monocyte %: 6.4 %
Neutrophil #: 6.5 x10 3/mm (ref 1.4–6.5)
RBC: 4.46 10*6/uL (ref 3.80–5.20)
RDW: 14.6 % — ABNORMAL HIGH (ref 11.5–14.5)
WBC: 10.5 x10 3/mm (ref 3.6–11.0)

## 2012-06-30 LAB — CANCER CTR PLATELET CT: Platelet: 63 x10 3/mm — ABNORMAL LOW (ref 150–440)

## 2012-07-16 ENCOUNTER — Ambulatory Visit: Payer: Self-pay | Admitting: Oncology

## 2012-07-20 LAB — CBC CANCER CENTER
Basophil #: 0.1 x10 3/mm (ref 0.0–0.1)
Basophil %: 0.7 %
Eosinophil #: 0.2 x10 3/mm (ref 0.0–0.7)
Eosinophil %: 1.5 %
HCT: 38.4 % (ref 35.0–47.0)
HGB: 13 g/dL (ref 12.0–16.0)
Lymphocyte #: 3.2 x10 3/mm (ref 1.0–3.6)
Lymphocyte %: 24.9 %
MCH: 29.6 pg (ref 26.0–34.0)
MCV: 88 fL (ref 80–100)
Monocyte #: 0.9 x10 3/mm (ref 0.2–0.9)
Monocyte %: 6.7 %
Neutrophil #: 8.5 x10 3/mm — ABNORMAL HIGH (ref 1.4–6.5)
RBC: 4.4 10*6/uL (ref 3.80–5.20)

## 2012-08-04 LAB — CBC CANCER CENTER
Basophil #: 0.1 x10 3/mm (ref 0.0–0.1)
Basophil %: 0.9 %
Eosinophil #: 0.2 x10 3/mm (ref 0.0–0.7)
Eosinophil %: 2.2 %
Lymphocyte #: 2.4 x10 3/mm (ref 1.0–3.6)
MCHC: 34 g/dL (ref 32.0–36.0)
MCV: 89 fL (ref 80–100)
Monocyte #: 0.7 x10 3/mm (ref 0.2–0.9)
Neutrophil #: 5.9 x10 3/mm (ref 1.4–6.5)
Platelet: 65 x10 3/mm — ABNORMAL LOW (ref 150–440)
RBC: 4.61 10*6/uL (ref 3.80–5.20)
WBC: 9.3 x10 3/mm (ref 3.6–11.0)

## 2012-08-16 ENCOUNTER — Ambulatory Visit: Payer: Self-pay | Admitting: Oncology

## 2012-08-25 ENCOUNTER — Ambulatory Visit: Payer: Self-pay | Admitting: Obstetrics and Gynecology

## 2012-08-31 LAB — CBC CANCER CENTER
Eosinophil %: 2 %
HCT: 40.9 % (ref 35.0–47.0)
HGB: 14.1 g/dL (ref 12.0–16.0)
Lymphocyte #: 3 x10 3/mm (ref 1.0–3.6)
MCH: 31 pg (ref 26.0–34.0)
MCHC: 34.4 g/dL (ref 32.0–36.0)
MCV: 90 fL (ref 80–100)
Monocyte #: 0.7 x10 3/mm (ref 0.2–0.9)
Neutrophil %: 61.9 %
Platelet: 110 x10 3/mm — ABNORMAL LOW (ref 150–440)
RBC: 4.54 10*6/uL (ref 3.80–5.20)
RDW: 14.7 % — ABNORMAL HIGH (ref 11.5–14.5)
WBC: 10.7 x10 3/mm (ref 3.6–11.0)

## 2012-09-15 ENCOUNTER — Ambulatory Visit: Payer: Self-pay | Admitting: Oncology

## 2013-10-04 ENCOUNTER — Ambulatory Visit: Payer: Self-pay | Admitting: Obstetrics and Gynecology

## 2014-07-10 NOTE — Op Note (Signed)
PATIENT NAME:  Gerhard MunchMITAL, Leita MR#:  [id number] DATE OF BIRTH:  11-30-1970  DATE OF PROCEDURE:  08/30/2011  PREOPERATIVE DIAGNOSES: Polymenorrhea and menorrhagia.   POSTOPERATIVE DIAGNOSES:  1. Polymenorrhea and menorrhagia.  2. Right hematosalpinx.   PROCEDURE: Laparoscopic supracervical hysterectomy and right salpingectomy.   SURGEON: Ricky L. Logan BoresEvans, MD  ASSISTANT: Jennell Cornerhomas Schermerhorn, M.D.   ANESTHESIA: General endotracheal.   FINDINGS: Grossly normal uterus, right hematosalpinx, grossly normal ovaries bilaterally, grossly normal left tube.   ESTIMATED BLOOD LOSS: 30 mL.   COMPLICATIONS: None.   SPECIMENS: Uterus, right tube.   DRAINS: Foley which was placed preoperatively and removed at the end of case.   PROCEDURE IN DETAIL: Patient is several years status post ablation, been having issues with thrombocytopenia since an episode of pancreatitis. Her periods have steadily gotten worse and with the help of Dr. Lorre NickGittin her platelets have gone up to a point where it is safe to proceed with definitive management. Discussed with patient, consent signed. Taken to the Operating Room, placed in supine position where general orotracheal anesthesia was established then placed in dorsal lithotomy position using Allen stirrups. Prepped and draped in usual sterile fashion. Cervix was visualized, grasped with a single-tooth tenaculum. Single-tooth and sound were placed and Steri-Stripped together and Foley catheter was placed.   11 port was placed infraumbilically and one each in left and right lower quadrants and proceeded with laparoscopic supracervical hysterectomy in usual fashion alternating Harmonic scalpel and Kleppinger.   Right tube is noted to be abnormal and dilated and bluish and was removed with the Harmonic scalpel.   The upper two-thirds of cervical stump was cauterized with hand of operator in vagina to ensure no vaginal trauma and uterus was removed in morcellator fashion through  the left lower quadrant wound.   Areas were copiously irrigated. Pressure lowered to 6 mmHg, all areas were seen to be hemostatic. Ports removed. Pneumoperitoneum was allowed to resolve. Incisions closed with deep of 0, subcutaneous with 3-0 Vicryl.   30 mL of 0.5% Sensorcaine was used in three 10 mL aliquots. 1 gram Ancef given IV preoperatively. Anticipate routine postoperative course.   ____________________________ Reatha Harpsicky L. Logan BoresEvans, MD rle:cms D: 08/30/2011 08:23:52 ET T: 08/30/2011 09:42:54 ET JOB#: [id number]  cc: Clide Clifficky L. Logan BoresEvans, MD, <Dictator> Augustina MoodICK L Cadence Haslam MD ELECTRONICALLY SIGNED 08/31/2011 10:54

## 2014-10-05 ENCOUNTER — Other Ambulatory Visit: Payer: Self-pay | Admitting: Obstetrics and Gynecology

## 2014-10-05 DIAGNOSIS — Z1231 Encounter for screening mammogram for malignant neoplasm of breast: Secondary | ICD-10-CM

## 2014-10-10 ENCOUNTER — Other Ambulatory Visit: Payer: Self-pay | Admitting: Obstetrics and Gynecology

## 2014-10-10 ENCOUNTER — Ambulatory Visit
Admission: RE | Admit: 2014-10-10 | Discharge: 2014-10-10 | Disposition: A | Discharge status: Home / Self Care | Payer: BLUE CROSS/BLUE SHIELD | Source: Ambulatory Visit | Attending: Obstetrics and Gynecology | Admitting: Obstetrics and Gynecology

## 2014-10-10 DIAGNOSIS — Z1231 Encounter for screening mammogram for malignant neoplasm of breast: Secondary | ICD-10-CM

## 2014-10-10 IMAGING — MG MM SCREENING BREAST TOMO BILATERAL
8 of 13 series · 8 of 29 positions shown · non-contrast
Comparison: Previous exam(s).

CLINICAL DATA: Screening.

EXAM:
DIGITAL SCREENING BILATERAL MAMMOGRAM WITH 3D TOMO WITH CAD

[R XCCM]
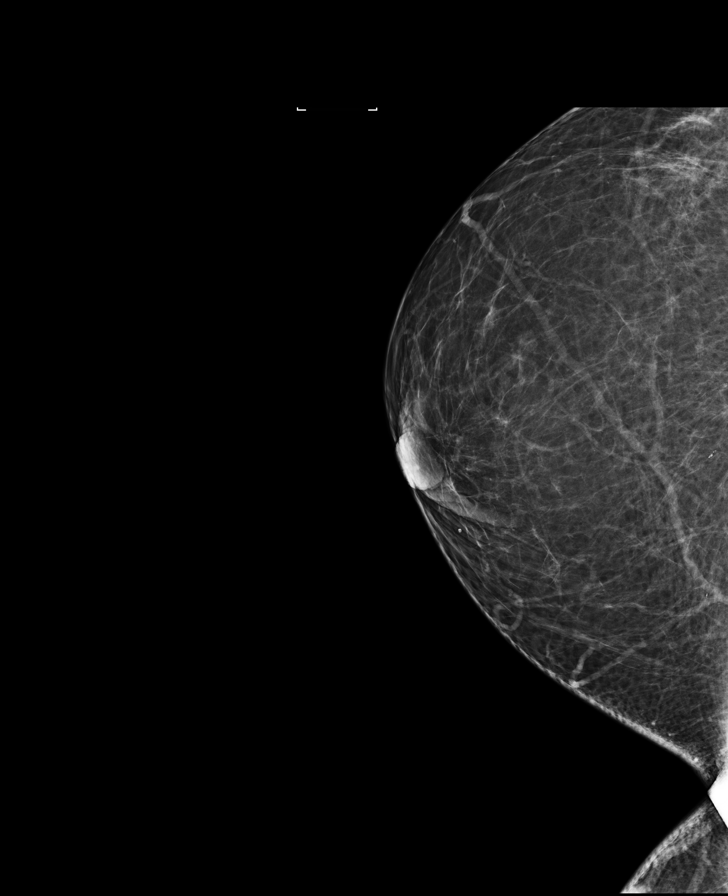

[R MLO synth-2D]
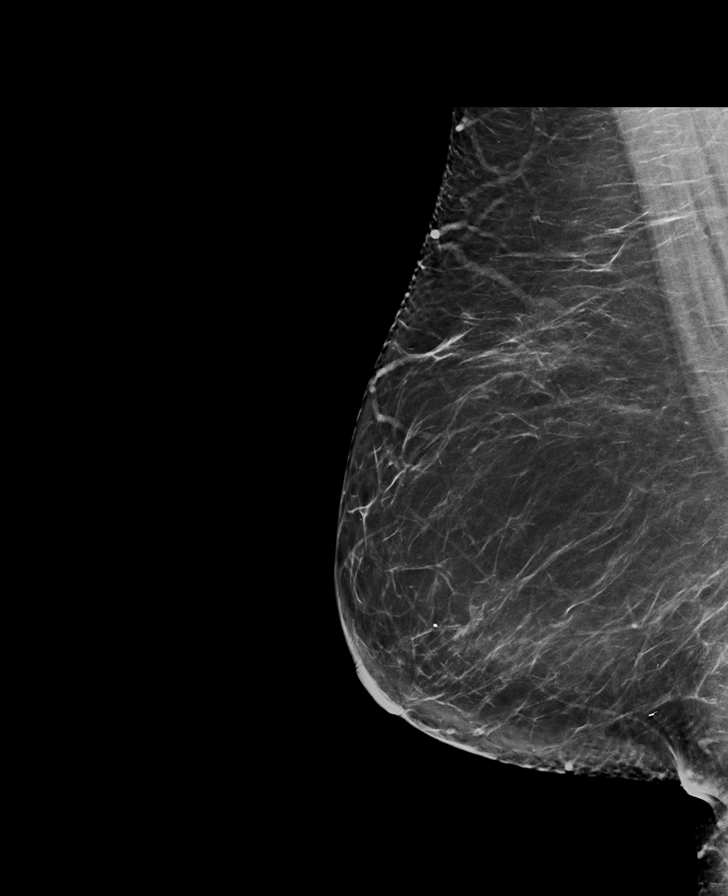

[L CC]
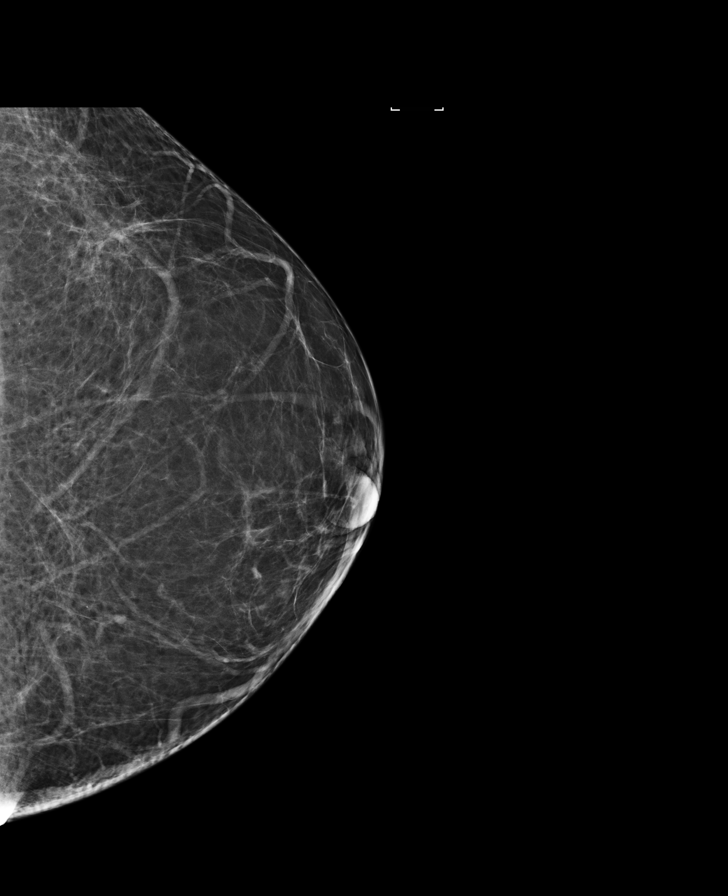

[R CC synth-2D]
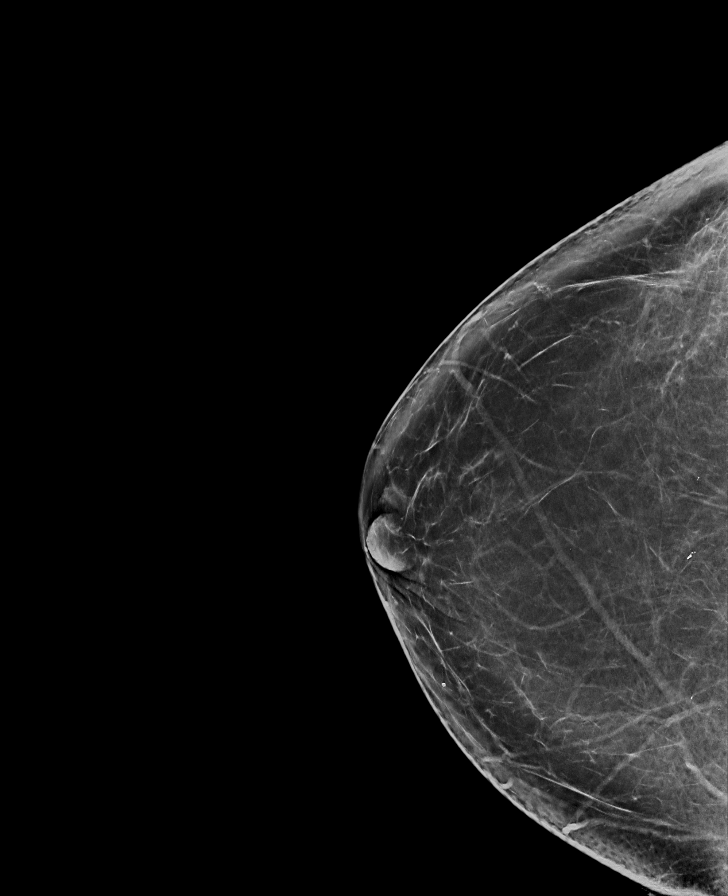

[L CC synth-2D]
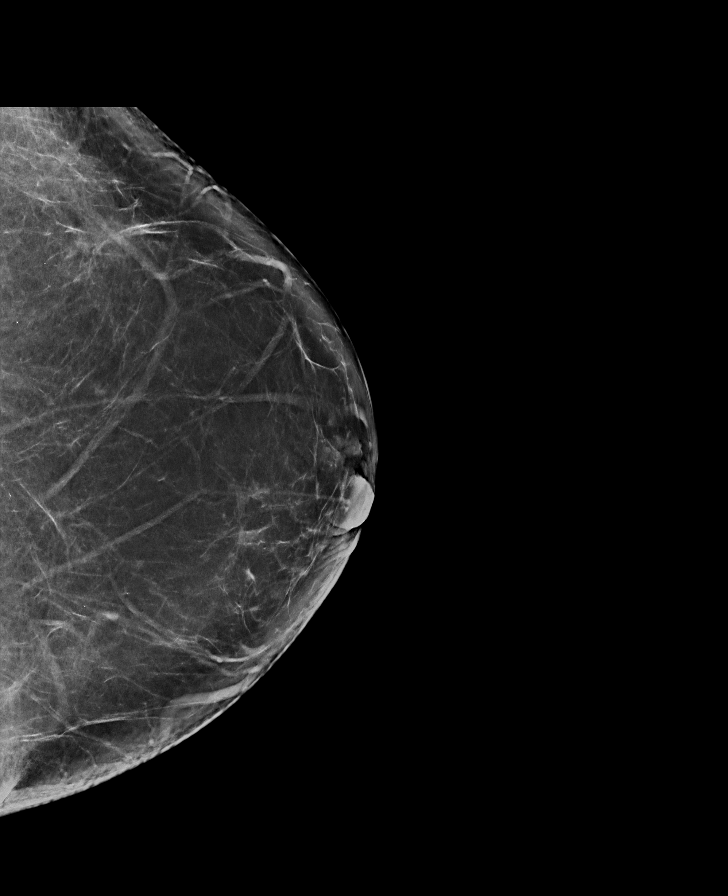

[R CC]
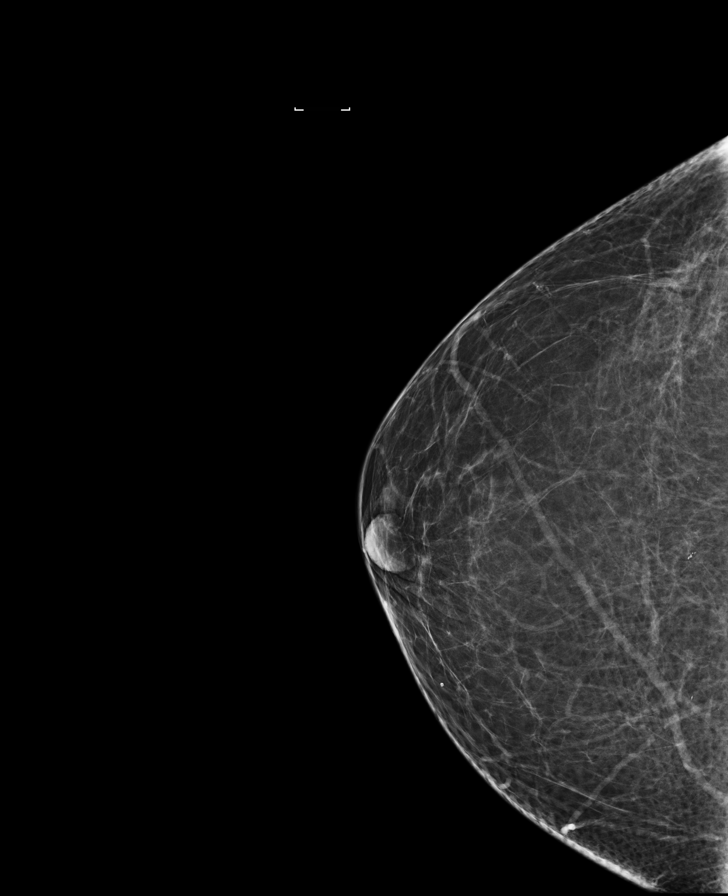

[L MLO]
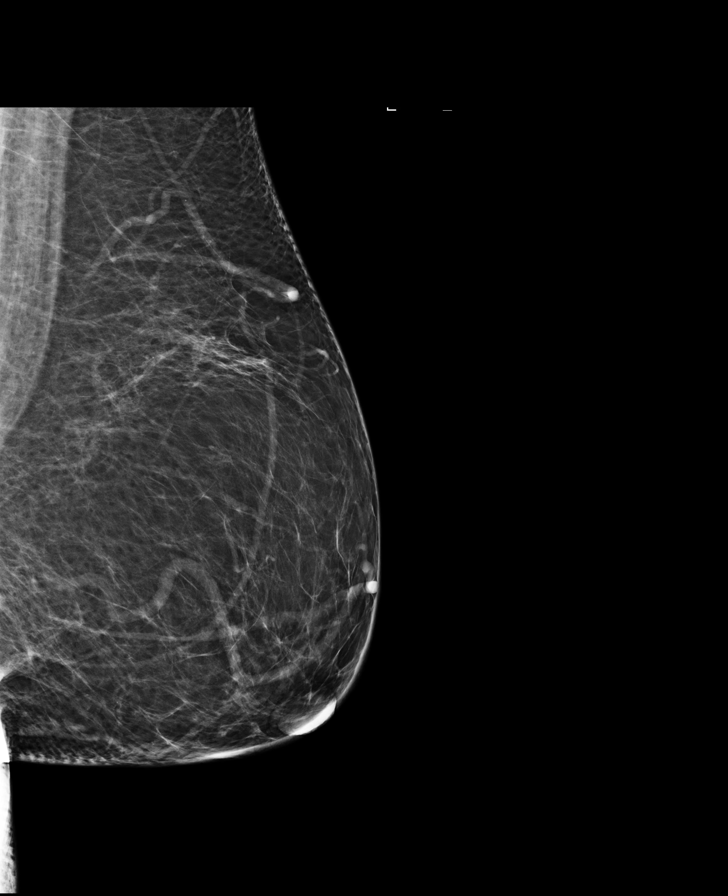

[R MLO]
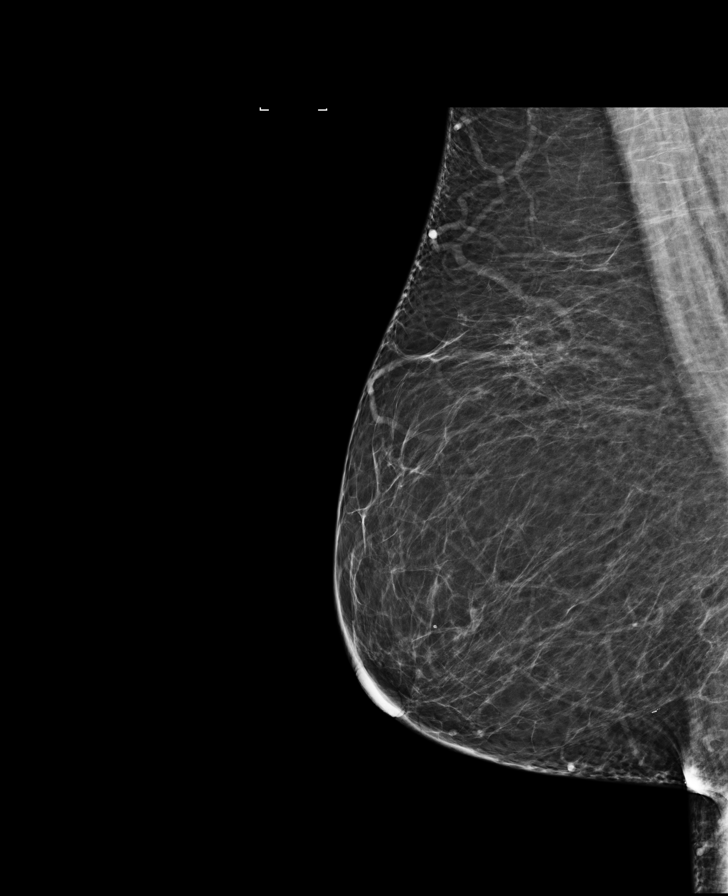

[8 of 29 positions shown; findings below may reference images not displayed]

ACR Breast Density Category b: There are scattered areas of
fibroglandular density.
FINDINGS: There are no findings suspicious for malignancy. Images were
processed with CAD.
IMPRESSION: No mammographic evidence of malignancy. A result letter of this
screening mammogram will be mailed directly to the patient.

RECOMMENDATION:
Screening mammogram in one year. (Code:[QP])

Based on the Tyrer-Cuzick risk assessment model, the patient has a
greater than 20% lifetime risk of developing breast cancer. By the
recommendations of the American Cancer Society, annual MRI and
annual mammography are recommended.

BI-RADS CATEGORY  1: Negative.

## 2016-08-05 ENCOUNTER — Other Ambulatory Visit: Payer: Self-pay | Admitting: Obstetrics and Gynecology

## 2016-08-05 DIAGNOSIS — Z1231 Encounter for screening mammogram for malignant neoplasm of breast: Secondary | ICD-10-CM

## 2016-08-16 ENCOUNTER — Ambulatory Visit
Admission: RE | Admit: 2016-08-16 | Discharge: 2016-08-16 | Disposition: A | Discharge status: Home / Self Care | Payer: 59 | Source: Ambulatory Visit | Attending: Obstetrics and Gynecology | Admitting: Obstetrics and Gynecology

## 2016-08-16 DIAGNOSIS — Z1231 Encounter for screening mammogram for malignant neoplasm of breast: Secondary | ICD-10-CM | POA: Diagnosis present

## 2016-11-03 ENCOUNTER — Emergency Department
Admission: EM | Admit: 2016-11-03 | Discharge: 2016-11-04 | Disposition: A | Discharge status: Home / Self Care | Payer: 59 | Attending: Emergency Medicine | Admitting: Emergency Medicine

## 2016-11-03 DIAGNOSIS — T50902A Poisoning by unspecified drugs, medicaments and biological substances, intentional self-harm, initial encounter: Secondary | ICD-10-CM

## 2016-11-03 DIAGNOSIS — T424X1A Poisoning by benzodiazepines, accidental (unintentional), initial encounter: Secondary | ICD-10-CM

## 2016-11-03 DIAGNOSIS — F4325 Adjustment disorder with mixed disturbance of emotions and conduct: Secondary | ICD-10-CM | POA: Diagnosis not present

## 2016-11-03 DIAGNOSIS — T424X2A Poisoning by benzodiazepines, intentional self-harm, initial encounter: Secondary | ICD-10-CM | POA: Diagnosis present

## 2016-11-03 DIAGNOSIS — Z79899 Other long term (current) drug therapy: Secondary | ICD-10-CM | POA: Insufficient documentation

## 2016-11-03 DIAGNOSIS — E119 Type 2 diabetes mellitus without complications: Secondary | ICD-10-CM | POA: Diagnosis not present

## 2016-11-03 DIAGNOSIS — Z7984 Long term (current) use of oral hypoglycemic drugs: Secondary | ICD-10-CM | POA: Diagnosis not present

## 2016-11-03 DIAGNOSIS — F17210 Nicotine dependence, cigarettes, uncomplicated: Secondary | ICD-10-CM | POA: Insufficient documentation

## 2016-11-03 DIAGNOSIS — F1210 Cannabis abuse, uncomplicated: Secondary | ICD-10-CM

## 2016-11-03 DIAGNOSIS — R45851 Suicidal ideations: Secondary | ICD-10-CM | POA: Diagnosis not present

## 2016-11-03 LAB — CBC
HCT: 44.5 % (ref 35.0–47.0)
Hemoglobin: 15.3 g/dL (ref 12.0–16.0)
MCH: 30.9 pg (ref 26.0–34.0)
MCHC: 34.4 g/dL (ref 32.0–36.0)
MCV: 89.8 fL (ref 80.0–100.0)
PLATELETS: 175 10*3/uL (ref 150–440)
RBC: 4.95 MIL/uL (ref 3.80–5.20)
RDW: 14 % (ref 11.5–14.5)
WBC: 13.1 10*3/uL — AB (ref 3.6–11.0)

## 2016-11-03 LAB — COMPREHENSIVE METABOLIC PANEL
ALT: 18 U/L (ref 14–54)
AST: 19 U/L (ref 15–41)
Albumin: 3.5 g/dL (ref 3.5–5.0)
Alkaline Phosphatase: 65 U/L (ref 38–126)
Anion gap: 7 (ref 5–15)
BUN: 12 mg/dL (ref 6–20)
CHLORIDE: 106 mmol/L (ref 101–111)
CO2: 26 mmol/L (ref 22–32)
CREATININE: 0.92 mg/dL (ref 0.44–1.00)
Calcium: 8.7 mg/dL — ABNORMAL LOW (ref 8.9–10.3)
Glucose, Bld: 151 mg/dL — ABNORMAL HIGH (ref 65–99)
POTASSIUM: 3.8 mmol/L (ref 3.5–5.1)
SODIUM: 139 mmol/L (ref 135–145)
TOTAL PROTEIN: 7.7 g/dL (ref 6.5–8.1)
Total Bilirubin: 0.8 mg/dL (ref 0.3–1.2)

## 2016-11-03 LAB — SALICYLATE LEVEL

## 2016-11-03 LAB — ACETAMINOPHEN LEVEL: Acetaminophen (Tylenol), Serum: <10 ug/mL — ABNORMAL LOW (ref 10–30)

## 2016-11-03 LAB — ETHANOL

## 2016-11-03 NOTE — ED Notes (Signed)

## 2016-11-03 NOTE — ED Notes (Signed)
Pt placed on 2L [place] due to 02 dropping to high 80s.

## 2016-11-03 NOTE — BH Assessment (Signed)
Referral information for Psychiatric Hospitalization faxed to;    [facility] [phone] or [phone])   [facility] [phone]),    [address] [phone]),    [facility] [phone]),    [facility] [phone]),    [facility] [phone]).

## 2016-11-03 NOTE — ED Triage Notes (Signed)
Pt arrives to ER from home, brought herself to ER after taking approx 10-12 Xanax around 9AM. Pt does admit that she did this because her and her husband have been fighting and that she was attempting to hurt herself. No hx of prior attempts. Pt does not know mg of Xanax and they are not prescribed to her. Denies SI. Pt tearful.

## 2016-11-03 NOTE — ED Notes (Signed)
Report received from [employer] Patient to be moved to [facility] 1.

## 2016-11-03 NOTE — ED Notes (Signed)
SOC MD recommending in patient treatment.  Pt not appropriate for BHU at this time.  Pt sleeping intermittently.

## 2016-11-03 NOTE — ED Notes (Signed)
Contacted poison control and talked to [place]. Must be monitored 6 hours post overdose. Pt took pills around 0900. No meds or other lab work to be done. Will continue to monitor and assess pt.

## 2016-11-03 NOTE — ED Provider Notes (Signed)
[facility] Emergency Department Provider Note  ____________________________________________   I have reviewed the triage vital signs and the nursing notes.   HISTORY  Chief Complaint Drug Overdose and Suicidal   History limited by: Not Limited   HPI Lori Calhoun is a 46 y.o. female who presents to the emergency department today after intentional overdose. This occurred roughly 5 hours prior to my evaluation. The patient overdose on Xanax. She states she did this after getting in an argument with her significant other. She did it in an attempt to harm herself. She states that she took about 12 pills. They were her dog's pills. She denies any other ingestion. She states that she has thought about hurting herself the past but denies any previous times.   History reviewed. No pertinent past medical history.  There are no active problems to display for this patient.   History reviewed. No pertinent surgical history.  Prior to Admission medications   Medication Sig Start Date End Date Taking? Authorizing Provider  glipiZIDE (GLUCOTROL XL) 10 MG 24 hr tablet Take 10 mg by mouth daily. 10/23/16  Yes [provider]  INVOKANA 300 MG TABS tablet Take 300 mg by mouth daily. 10/14/16  Yes [provider]  JENTADUETO XR 2.07-998 MG TB24 Take 1 tablet by mouth daily. 10/14/16  Yes [provider]  losartan-hydrochlorothiazide (HYZAAR) 50-12.5 MG tablet Take 1 tablet by mouth daily. 10/13/16  Yes [provider]  naproxen sodium (ANAPROX) 220 MG tablet Take 220 mg by mouth daily.   Yes [provider]  pioglitazone (ACTOS) 15 MG tablet Take 15 mg by mouth daily. 10/13/16  Yes [provider]  potassium chloride (K-DUR) 10 MEQ tablet Take 10 mEq by mouth daily. 10/13/16  Yes [provider]    Allergies Celebrex [celecoxib] and Penicillins  Family History  Problem Relation Age of Onset  . Breast cancer Mother  13  . Breast cancer Maternal Grandmother 31       60's    Social History Social History  Substance Use Topics  . Smoking status: Current Every Day Smoker  . Smokeless tobacco: Not on file  . Alcohol use Yes    Review of Systems Constitutional: No fever/chills Eyes: No visual changes. ENT: No sore throat. Cardiovascular: Denies chest pain. Respiratory: Denies shortness of breath. Gastrointestinal: No abdominal pain.  No nausea, no vomiting.  No diarrhea.   Genitourinary: Negative for dysuria. Musculoskeletal: Negative for back pain. Skin: Negative for rash. Neurological: Negative for headaches, focal weakness or numbness. Psychiatric: Positive for depression. ____________________________________________   PHYSICAL EXAM:  VITAL SIGNS: ED Triage Vitals  Enc Vitals Group     BP 11/03/16 1252 (!) 187/77     Pulse --      Resp 11/03/16 1252 18     Temp 11/03/16 1252 98.7 F (37.1 C)     Temp Source 11/03/16 1252 Oral     SpO2 11/03/16 1252 100 %     Weight 11/03/16 1253 264 lb (119.7 kg)     Height 11/03/16 1253 5\' 11"  (1.803 m)   Constitutional: Alert and oriented.  Eyes: Conjunctivae are normal.  ENT   Head: Normocephalic and atraumatic.   Nose: No congestion/rhinnorhea.   Mouth/Throat: Mucous membranes are moist.   Neck: No stridor. Hematological/Lymphatic/Immunilogical: No cervical lymphadenopathy. Cardiovascular: Normal rate, regular rhythm.  No murmurs, rubs, or gallops.  Respiratory: Normal respiratory effort without tachypnea nor retractions. Breath sounds are clear and equal bilaterally. No wheezes/rales/rhonchi. Gastrointestinal:  Soft and non tender. No rebound. No guarding.  Genitourinary: Deferred Musculoskeletal: Normal range of motion in all extremities. No lower extremity edema. Neurologic:  Normal speech and language. No gross focal neurologic deficits are appreciated.  Skin:  Skin is warm, dry and intact. No rash noted. Psychiatric:  Depressed  ____________________________________________    LABS (pertinent positives/negatives)  Labs Reviewed  COMPREHENSIVE METABOLIC PANEL - Abnormal; Notable for the following:       Result Value   Glucose, Bld 151 (*)    Calcium 8.7 (*)    All other components within normal limits  ACETAMINOPHEN LEVEL - Abnormal; Notable for the following:    Acetaminophen (Tylenol), Serum <10 (*)    All other components within normal limits  CBC - Abnormal; Notable for the following:    WBC 13.1 (*)    All other components within normal limits  ETHANOL  SALICYLATE LEVEL  URINE DRUG SCREEN, QUALITATIVE ([facility] ONLY)  CBG MONITORING, ED  POC URINE PREG, ED     ____________________________________________   EKG  I, Phineas Semen, attending physician, personally viewed and interpreted this EKG  EKG Time: 1255 Rate: 98 Rhythm: normal sinus rhythm Axis: normal Intervals: qtc 472 QRS: narrow ST changes: no st elevation Impression: possible left atrial enlargement  ____________________________________________    RADIOLOGY  None  ____________________________________________   PROCEDURES  Procedures  ____________________________________________   INITIAL IMPRESSION / ASSESSMENT AND PLAN / ED COURSE  Pertinent labs & imaging results that were available during my care of the patient were reviewed by me and considered in my medical decision making (see chart for details).  Patient presented to the emergency department today after an intentional overdose on Xanax in an attempt to harm herself. This did occur roughly 5 hours prior to my evaluation. On my exam patient is awake, alert and oriented. However given the patient's ingestion and thoughts of self-harm will place under IVC for evaluation by psychiatry.  ____________________________________________   FINAL CLINICAL IMPRESSION(S) / ED DIAGNOSES  Final diagnoses:  Intentional drug overdose, initial encounter Operating Room Services)      Note: This dictation was prepared with Dragon dictation. Any transcriptional errors that result from this process are unintentional     Phineas Semen, MD 11/03/16 1458
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
## 2016-11-03 NOTE — ED Notes (Signed)
Pt. To BHU from ED ambulatory without difficulty, to room  BHU 1. Report from IRis RN. Pt. Is alert and oriented, warm and dry in no distress. Pt. Denies SI, HI, and AVH. Pt. Calm and cooperative. Pt. Made aware of security cameras and Q15 minute rounds. Pt. Encouraged to let Nursing staff know of any concerns or needs.

## 2016-11-03 NOTE — ED Provider Notes (Signed)
-----------------------------------------   4:57 PM on 11/03/2016 -----------------------------------------  On reassessment patient is somnolent but arouses to voice and is answering questions appropriately.  O2 sat still mid 90s on nasal cannula. Will continue to monitor but anticipate patient likely will be cleared for BHU soon.   ----------------------------------------- 7:52 PM on 11/03/2016 ----------------------------------------- Patient alert, sitting up in bed, and is medically stable.  Will clear for BHU.    Dionne Bucy, MD 11/04/16 [phone]

## 2016-11-04 DIAGNOSIS — F4325 Adjustment disorder with mixed disturbance of emotions and conduct: Secondary | ICD-10-CM | POA: Diagnosis not present

## 2016-11-04 DIAGNOSIS — T424X1A Poisoning by benzodiazepines, accidental (unintentional), initial encounter: Secondary | ICD-10-CM

## 2016-11-04 DIAGNOSIS — F1210 Cannabis abuse, uncomplicated: Secondary | ICD-10-CM

## 2016-11-04 LAB — URINE DRUG SCREEN, QUALITATIVE (ARMC ONLY)
AMPHETAMINES, UR SCREEN: NOT DETECTED
BENZODIAZEPINE, UR SCRN: POSITIVE — AB
Barbiturates, Ur Screen: NOT DETECTED
Cannabinoid 50 Ng, Ur ~~LOC~~: POSITIVE — AB
Cocaine Metabolite,Ur ~~LOC~~: NOT DETECTED
MDMA (ECSTASY) UR SCREEN: NOT DETECTED
METHADONE SCREEN, URINE: NOT DETECTED
Opiate, Ur Screen: NOT DETECTED
PHENCYCLIDINE (PCP) UR S: NOT DETECTED
Tricyclic, Ur Screen: NOT DETECTED

## 2016-11-04 LAB — PREGNANCY, URINE: PREG TEST UR: NEGATIVE

## 2016-11-04 MED ORDER — ACETAMINOPHEN 325 MG PO TABS
650.0000 mg | ORAL_TABLET | Freq: Once | ORAL | Status: AC
  Administered 2016-11-04: 650 mg via ORAL

## 2016-11-04 MED ORDER — ACETAMINOPHEN 325 MG PO TABS
ORAL_TABLET | ORAL | Status: AC
  Filled 2016-11-04: qty 2

## 2016-11-04 NOTE — ED Notes (Signed)
Patient has been accepted to [facility] in [place].  Patient assigned to room 246-2 Accepting physician is Dr. Gwynneth Munson.  Call report to [phone].  Representative was [employer].  ER Staff is aware of it [facility].; Dr. Zenda Alpers, ER MD & Selena Batten Patient's Nurse)    Hospital located at [address] [place]

## 2016-11-04 NOTE — ED Provider Notes (Signed)
-----------------------------------------   8:26 AM on 11/04/2016 -----------------------------------------   Blood pressure 138/81, pulse 81, temperature (!) 97.5 F (36.4 C), temperature source Oral, resp. rate 16, height 5\' 11"  (1.803 m), weight 119.7 kg (264 lb), SpO2 95 %.  The patient had no acute events since last update.  Calm and cooperative at this time.  The patient has been accepted to [facility] in [place].     Rebecka Apley, MD 11/04/16 [phone]

## 2016-11-04 NOTE — ED Notes (Signed)
This Clinical research associate informed patient of transfer to [facility] for inpatient services. Patient upset of about transfer but understanding. Patient states, "I just had a moment that was all, I figured I would only stay for 24 hours and then go home."

## 2016-11-04 NOTE — ED Notes (Signed)
Patient able to give a urine sample.

## 2016-11-04 NOTE — Consult Note (Signed)
[facility] Face-to-Face Psychiatry Consult   Reason for Consult:  Consult for 46 year old woman who came to the emergency room after taking an overdose of Xanax Referring Physician:  Rifenbark Patient Identification: Lori Calhoun MRN:  [id number] Principal Diagnosis: Adjustment disorder with mixed disturbance of emotions and conduct Diagnosis:   Patient Active Problem List   Diagnosis Date Noted  . Benzodiazepine overdose [T42.4X1A] 11/04/2016  . Adjustment disorder with mixed disturbance of emotions and conduct [F43.25] 11/04/2016  . Marijuana abuse [F12.10] 11/04/2016    Total Time spent with patient: 1 hour  Subjective:   Lori Calhoun is a 46 y.o. female patient admitted with "I really did not want to kill myself".  HPI:  Patient interviewed chart reviewed. This is a 46 year old woman came to the emergency room after taking an excessive number of Xanax is. Patient says last night she and her husband were arguing. Things got very heated and elevated. She thinks her husband was drunk and she notes that she and he have been having a lot more fights recently. Eventually she actually called the law on him which made him even more angry. They continued to fuss and she impulsively swallowed some pills. The pills in question were alprazolam that had been prescribed for one of their dogs. The patient does not regularly take this medicine. She says at the time she thought she would just go to sleep with the pills and she has admitted on at least one occasion that she was thinking of hurting herself. However after taking the pill she said she lay down for just a short while and then decided she needed to get help so she drove herself here to the hospital. Other than yesterday she denies any symptoms of depression. Regular mood is not depressed and not hopeless. Able to identify multiple things she enjoys. Sleeping adequately most nights appetite okay. No suicidal ideation. Not drinking regularly. Does use  marijuana regularly.  Social history: Patient and her husband have been fighting and arguing more recently. It has not gotten physical but it has been more frustrating at home. Patient does work full time at one of the Scientist, research (physical sciences) in town. Husband is also working full time. No children.  Medical history: Diabetes on oral medicine.  Substance abuse history: Denies history of alcohol abuse. Uses marijuana semiregular. Denies feeling like it is a problem.  Past Psychiatric History: Patient has no past psychiatric history. No prior psychiatric admissions no history of suicide attempts not prescribed or taking any psychiatric medicine. No history of depression previously.  Risk to Self: Suicidal Ideation: Yes-Currently Present Suicidal Intent: Yes-Currently Present Is patient at risk for suicide?: Yes Suicidal Plan?: Yes-Currently Present Specify Current Suicidal Plan: Reports of none Access to Means: Yes Specify Access to Suicidal Means: Overdosing on medicine What has been your use of drugs/alcohol within the last 12 months?: Reports of none Other Self Harm Risks: Reports of none Triggers for Past Attempts: None known Intentional Self Injurious Behavior: None Risk to Others: Homicidal Ideation: No Thoughts of Harm to Others: No Current Homicidal Intent: No Current Homicidal Plan: No Access to Homicidal Means: No Identified Victim: Reports of none History of harm to others?: No Assessment of Violence: None Noted Violent Behavior Description: Reports of none Does patient have access to weapons?: No Criminal Charges Pending?: No Does patient have a court date: No Prior Inpatient Therapy: Prior Inpatient Therapy: No Prior Therapy Dates: Reports of none Prior Therapy Facilty/Provider(s): Reports of none Reason for Treatment: Reports of none  Prior Outpatient Therapy: Prior Outpatient Therapy: No Prior Therapy Dates: Reports of none Prior Therapy Facilty/Provider(s): Reports of  none Reason for Treatment: Reports of none Does patient have an ACCT team?: No Does patient have Intensive In-House Services?  : No Does patient have [facility] services? : No Does patient have P4CC services?: No  Past Medical History: History reviewed. No pertinent past medical history. History reviewed. No pertinent surgical history. Family History:  Family History  Problem Relation Age of Onset  . Breast cancer Mother 14  . Breast cancer Maternal Grandmother 76       60's   Family Psychiatric  History: Had a mother who had a "nervous breakdown" Social History:  History  Alcohol Use  . Yes     History  Drug use: Unknown  . Types: Marijuana    Social History   Social History  . Marital status: Married    Spouse name: N/A  . Number of children: N/A  . Years of education: N/A   Social History Main Topics  . Smoking status: Current Every Day Smoker  . Smokeless tobacco: None  . Alcohol use Yes  . Drug use: Unknown    Types: Marijuana  . Sexual activity: Not Asked   Other Topics Concern  . None   Social History Narrative  . None   Additional Social History:    Allergies:   Allergies  Allergen Reactions  . Celebrex [Celecoxib] Anaphylaxis  . Penicillins Other (See Comments)    Pancreatitis  Has patient had a PCN reaction causing immediate rash, facial/tongue/throat swelling, SOB or lightheadedness with hypotension: Unknown Has patient had a PCN reaction causing severe rash involving mucus membranes or skin necrosis: Unknown Has patient had a PCN reaction that required hospitalization: Unknown Has patient had a PCN reaction occurring within the last 10 years: Unknown If all of the above answers are "NO", then may proceed with Cephalosporin use.     Labs:  Results for orders placed or performed during the hospital encounter of 11/03/16 (from the past 48 hour(s))  Urine Drug Screen, Qualitative     Status: Abnormal   Collection Time: 11/03/16 12:54 PM   Result Value Ref Range   Tricyclic, Ur Screen NONE DETECTED NONE DETECTED   Amphetamines, Ur Screen NONE DETECTED NONE DETECTED   MDMA (Ecstasy)Ur Screen NONE DETECTED NONE DETECTED   Cocaine Metabolite,Ur [place] NONE DETECTED NONE DETECTED   Opiate, Ur Screen NONE DETECTED NONE DETECTED   Phencyclidine (PCP) Ur S NONE DETECTED NONE DETECTED   Cannabinoid 50 Ng, Ur [place] POSITIVE (A) NONE DETECTED   Barbiturates, Ur Screen NONE DETECTED NONE DETECTED   Benzodiazepine, Ur Scrn POSITIVE (A) NONE DETECTED   Methadone Scn, Ur NONE DETECTED NONE DETECTED    Comment: (NOTE) 888  Tricyclics, urine               Cutoff 1000 ng/mL 200  Amphetamines, urine             Cutoff 1000 ng/mL 300  MDMA (Ecstasy), urine           Cutoff 500 ng/mL 400  Cocaine Metabolite, urine       Cutoff 300 ng/mL 500  Opiate, urine                   Cutoff 300 ng/mL 600  Phencyclidine (PCP), urine      Cutoff 25 ng/mL 700  Cannabinoid, urine  Cutoff 50 ng/mL 800  Barbiturates, urine             Cutoff 200 ng/mL 900  Benzodiazepine, urine           Cutoff 200 ng/mL 1000 Methadone, urine                Cutoff 300 ng/mL 1100 1200 The urine drug screen provides only a preliminary, unconfirmed 1300 analytical test result and should not be used for non-medical 1400 purposes. Clinical consideration and professional judgment should 1500 be applied to any positive drug screen result due to possible 1600 interfering substances. A more specific alternate chemical method 1700 must be used in order to obtain a confirmed analytical result.  1800 Gas chromato graphy / mass spectrometry (GC/MS) is the preferred 1900 confirmatory method.   Comprehensive metabolic panel     Status: Abnormal   Collection Time: 11/03/16 12:56 PM  Result Value Ref Range   Sodium 139 135 - 145 mmol/L   Potassium 3.8 3.5 - 5.1 mmol/L   Chloride 106 101 - 111 mmol/L   CO2 26 22 - 32 mmol/L   Glucose, Bld 151 (H) 65 - 99 mg/dL   BUN 12 6 -  20 mg/dL   Creatinine, Ser 0.92 0.44 - 1.00 mg/dL   Calcium 8.7 (L) 8.9 - 10.3 mg/dL   Total Protein 7.7 6.5 - 8.1 g/dL   Albumin 3.5 3.5 - 5.0 g/dL   AST 19 15 - 41 U/L   ALT 18 14 - 54 U/L   Alkaline Phosphatase 65 38 - 126 U/L   Total Bilirubin 0.8 0.3 - 1.2 mg/dL   GFR calc non Af Amer >60 >60 mL/min   GFR calc Af Amer >60 >60 mL/min    Comment: (NOTE) The eGFR has been calculated using the CKD EPI equation. This calculation has not been validated in all clinical situations. eGFR's persistently <60 mL/min signify possible Chronic Kidney Disease.    Anion gap 7 5 - 15  Ethanol     Status: None   Collection Time: 11/03/16 12:56 PM  Result Value Ref Range   Alcohol, Ethyl (B) <5 <5 mg/dL    Comment:        LOWEST DETECTABLE LIMIT FOR SERUM ALCOHOL IS 5 mg/dL FOR MEDICAL PURPOSES ONLY   Salicylate level     Status: None   Collection Time: 11/03/16 12:56 PM  Result Value Ref Range   Salicylate Lvl <2.0 2.8 - 30.0 mg/dL  Acetaminophen level     Status: Abnormal   Collection Time: 11/03/16 12:56 PM  Result Value Ref Range   Acetaminophen (Tylenol), Serum <10 (L) 10 - 30 ug/mL    Comment:        THERAPEUTIC CONCENTRATIONS VARY SIGNIFICANTLY. A RANGE OF 10-30 ug/mL MAY BE AN EFFECTIVE CONCENTRATION FOR MANY PATIENTS. HOWEVER, SOME ARE BEST TREATED AT CONCENTRATIONS OUTSIDE THIS RANGE. ACETAMINOPHEN CONCENTRATIONS >150 ug/mL AT 4 HOURS AFTER INGESTION AND >50 ug/mL AT 12 HOURS AFTER INGESTION ARE OFTEN ASSOCIATED WITH TOXIC REACTIONS.   cbc     Status: Abnormal   Collection Time: 11/03/16 12:56 PM  Result Value Ref Range   WBC 13.1 (H) 3.6 - 11.0 K/uL   RBC 4.95 3.80 - 5.20 MIL/uL   Hemoglobin 15.3 12.0 - 16.0 g/dL   HCT 44.5 35.0 - 47.0 %   MCV 89.8 80.0 - 100.0 fL   MCH 30.9 26.0 - 34.0 pg   MCHC 34.4 32.0 - 36.0 g/dL   RDW  14.0 11.5 - 14.5 %   Platelets 175 150 - 440 K/uL  Pregnancy, urine     Status: None   Collection Time: 11/04/16  6:20 AM  Result Value  Ref Range   Preg Test, Ur NEGATIVE NEGATIVE    No current facility-administered medications for this encounter.    Current Outpatient Prescriptions  Medication Sig Dispense Refill  . glipiZIDE (GLUCOTROL XL) 10 MG 24 hr tablet Take 10 mg by mouth daily.  0  . INVOKANA 300 MG TABS tablet Take 300 mg by mouth daily.  1  . JENTADUETO XR 2.07-998 MG TB24 Take 1 tablet by mouth daily.  1  . losartan-hydrochlorothiazide (HYZAAR) 50-12.5 MG tablet Take 1 tablet by mouth daily.  1  . naproxen sodium (ANAPROX) 220 MG tablet Take 220 mg by mouth daily.    . pioglitazone (ACTOS) 15 MG tablet Take 15 mg by mouth daily.  1  . potassium chloride (K-DUR) 10 MEQ tablet Take 10 mEq by mouth daily.  0    Musculoskeletal: Strength & Muscle Tone: within normal limits Gait & Station: normal Patient leans: N/A  Psychiatric Specialty Exam: Physical Exam  Nursing note and vitals reviewed. Constitutional: She appears well-developed and well-nourished.  HENT:  Head: Normocephalic and atraumatic.  Eyes: Pupils are equal, round, and reactive to light. Conjunctivae are normal.  Neck: Normal range of motion.  Cardiovascular: Regular rhythm and normal heart sounds.   Respiratory: Effort normal. No respiratory distress.  GI: Soft.  Musculoskeletal: Normal range of motion.  Neurological: She is alert.  Skin: Skin is warm and dry.  Psychiatric: She has a normal mood and affect. Her speech is normal and behavior is normal. Judgment and thought content normal. Cognition and memory are normal.    Review of Systems  Constitutional: Negative.   HENT: Negative.   Eyes: Negative.   Respiratory: Negative.   Cardiovascular: Negative.   Gastrointestinal: Negative.   Musculoskeletal: Negative.   Skin: Negative.   Neurological: Negative.   Psychiatric/Behavioral: Negative for depression, hallucinations, memory loss, substance abuse and suicidal ideas. The patient is not nervous/anxious and does not have insomnia.      Blood pressure (!) 150/74, pulse 82, temperature 98.3 F (36.8 C), temperature source Oral, resp. rate 18, height '5\' 11"'  (1.803 m), weight 119.7 kg (264 lb), SpO2 96 %.Body mass index is 36.82 kg/m.  General Appearance: Casual  Eye Contact:  Fair  Speech:  Normal Rate  Volume:  Normal  Mood:  Euthymic  Affect:  Constricted  Thought Process:  Goal Directed  Orientation:  Full (Time, Place, and Person)  Thought Content:  Logical  Suicidal Thoughts:  No  Homicidal Thoughts:  No  Memory:  Immediate;   Good Recent;   Fair Remote;   Fair  Judgement:  Fair  Insight:  Fair  Psychomotor Activity:  Decreased  Concentration:  Concentration: Fair  Recall:  [employer] of Knowledge:  Fair  Language:  Fair  Akathisia:  No  Handed:  Right  AIMS (if indicated):     Assets:  Desire for Improvement Housing Resilience  ADL's:  Intact  Cognition:  WNL  Sleep:        Treatment Plan Summary: Plan 46 year old woman took an overdose of Xanax. She is not reporting symptoms of major depression. Completely denies suicidal ideation now. She presents this as having been a spur of the moment thing that happened because she was very frustrated with her husband. No evidence of psychosis no past psychiatric  history. Patient at this time no longer meets commitment criteria. She does not need to be admitted to a psychiatric hospital. She has been educated about the use of benzodiazepines and encouraged to consider getting follow-up psychiatric care and will be given resources for intake appointments if needed. Discontinue IVC. Case reviewed with emergency room physician. Patient can be released home.  Disposition: Patient does not meet criteria for psychiatric inpatient admission. Supportive therapy provided about ongoing stressors. Discussed crisis plan, support from social network, calling 911, coming to the Emergency Department, and calling Suicide Hotline.  Alethia Berthold, MD 11/04/2016 5:36 PM

## 2016-11-04 NOTE — ED Notes (Signed)
Patient awake and alert sitting up in bed . Pt made aware of transfer and is calm and agreeable, but has concerns regarding the location and length of stay at facility. Pt currently denies SI/HI and A/V hallucinations. Pt remains safe with 15 minute checks.

## 2016-11-04 NOTE — ED Notes (Signed)
Lori Calhoun in [place] notified on phone of pt's upcoming discharge.

## 2016-11-04 NOTE — ED Notes (Signed)
Pt discharged to lobby. Pt was stable and appreciative at that time. All papers  were given and valuables returned. Verbal understanding expressed. Denies SI/HI and A/VH. Pt given opportunity to express concerns and ask questions. °

## 2016-11-04 NOTE — BH Assessment (Signed)
Lori Calhoun Lori Calhoun) informed pt's bed is no longer needed.

## 2016-11-04 NOTE — ED Provider Notes (Signed)
Dr. Toni Amend reevaluated the patient and he feels she no longer requires involuntary commitment and is medically stable for discharge with outpatient therapy.   Merrily Brittle, MD 11/04/16 1306

## 2016-11-04 NOTE — ED Notes (Signed)
Report given to Hurshel Party, RN at [facility] in [place]

## 2016-11-04 NOTE — Discharge Instructions (Signed)
Please make an appointment to establish care with a therapist as an outpatient for reevaluation. Return to the emergency department for any concerns whatsoever.  It was a pleasure to take care of you today, and thank you for coming to our emergency department.  If you have any questions or concerns before leaving please ask the nurse to grab me and I'm more than happy to go through your aftercare instructions again.  If you were prescribed any opioid pain medication today such as Norco, Vicodin, Percocet, morphine, hydrocodone, or oxycodone please make sure you do not drive when you are taking this medication as it can alter your ability to drive safely.  If you have any concerns once you are home that you are not improving or are in fact getting worse before you can make it to your follow-up appointment, please do not hesitate to call 911 and come back for further evaluation.  Merrily Brittle, MD  Results for orders placed or performed during the hospital encounter of 11/03/16  Comprehensive metabolic panel  Result Value Ref Range   Sodium 139 135 - 145 mmol/L   Potassium 3.8 3.5 - 5.1 mmol/L   Chloride 106 101 - 111 mmol/L   CO2 26 22 - 32 mmol/L   Glucose, Bld 151 (H) 65 - 99 mg/dL   BUN 12 6 - 20 mg/dL   Creatinine, Ser 0.62 0.44 - 1.00 mg/dL   Calcium 8.7 (L) 8.9 - 10.3 mg/dL   Total Protein 7.7 6.5 - 8.1 g/dL   Albumin 3.5 3.5 - 5.0 g/dL   AST 19 15 - 41 U/L   ALT 18 14 - 54 U/L   Alkaline Phosphatase 65 38 - 126 U/L   Total Bilirubin 0.8 0.3 - 1.2 mg/dL   GFR calc non Af Amer >60 >60 mL/min   GFR calc Af Amer >60 >60 mL/min   Anion gap 7 5 - 15  Ethanol  Result Value Ref Range   Alcohol, Ethyl (B) <5 <5 mg/dL  Salicylate level  Result Value Ref Range   Salicylate Lvl <7.0 2.8 - 30.0 mg/dL  Acetaminophen level  Result Value Ref Range   Acetaminophen (Tylenol), Serum <10 (L) 10 - 30 ug/mL  cbc  Result Value Ref Range   WBC 13.1 (H) 3.6 - 11.0 K/uL   RBC 4.95 3.80 - 5.20  MIL/uL   Hemoglobin 15.3 12.0 - 16.0 g/dL   HCT 69.4 85.4 - 62.7 %   MCV 89.8 80.0 - 100.0 fL   MCH 30.9 26.0 - 34.0 pg   MCHC 34.4 32.0 - 36.0 g/dL   RDW 03.5 00.9 - 38.1 %   Platelets 175 150 - 440 K/uL  Urine Drug Screen, Qualitative  Result Value Ref Range   Tricyclic, Ur Screen NONE DETECTED NONE DETECTED   Amphetamines, Ur Screen NONE DETECTED NONE DETECTED   MDMA (Ecstasy)Ur Screen NONE DETECTED NONE DETECTED   Cocaine Metabolite,Ur [place] NONE DETECTED NONE DETECTED   Opiate, Ur Screen NONE DETECTED NONE DETECTED   Phencyclidine (PCP) Ur S NONE DETECTED NONE DETECTED   Cannabinoid 50 Ng, Ur [place] POSITIVE (A) NONE DETECTED   Barbiturates, Ur Screen NONE DETECTED NONE DETECTED   Benzodiazepine, Ur Scrn POSITIVE (A) NONE DETECTED   Methadone Scn, Ur NONE DETECTED NONE DETECTED  Pregnancy, urine  Result Value Ref Range   Preg Test, Ur NEGATIVE NEGATIVE

## 2016-11-04 NOTE — ED Notes (Signed)
Patient eating lunch watching tv.

## 2017-04-04 DIAGNOSIS — E1142 Type 2 diabetes mellitus with diabetic polyneuropathy: Secondary | ICD-10-CM | POA: Diagnosis not present

## 2017-04-04 DIAGNOSIS — E538 Deficiency of other specified B group vitamins: Secondary | ICD-10-CM | POA: Diagnosis not present

## 2017-04-04 DIAGNOSIS — E1165 Type 2 diabetes mellitus with hyperglycemia: Secondary | ICD-10-CM | POA: Diagnosis not present

## 2017-04-11 DIAGNOSIS — E538 Deficiency of other specified B group vitamins: Secondary | ICD-10-CM | POA: Diagnosis not present

## 2017-04-11 DIAGNOSIS — E1142 Type 2 diabetes mellitus with diabetic polyneuropathy: Secondary | ICD-10-CM | POA: Diagnosis not present

## 2017-04-11 DIAGNOSIS — E1165 Type 2 diabetes mellitus with hyperglycemia: Secondary | ICD-10-CM | POA: Diagnosis not present

## 2017-04-28 DIAGNOSIS — K644 Residual hemorrhoidal skin tags: Secondary | ICD-10-CM | POA: Diagnosis not present

## 2017-04-28 DIAGNOSIS — K648 Other hemorrhoids: Secondary | ICD-10-CM | POA: Diagnosis not present

## 2017-05-07 DIAGNOSIS — K644 Residual hemorrhoidal skin tags: Secondary | ICD-10-CM | POA: Diagnosis not present

## 2017-05-07 DIAGNOSIS — K648 Other hemorrhoids: Secondary | ICD-10-CM | POA: Diagnosis not present

## 2017-06-05 DIAGNOSIS — K648 Other hemorrhoids: Secondary | ICD-10-CM | POA: Diagnosis not present

## 2017-06-13 DIAGNOSIS — L918 Other hypertrophic disorders of the skin: Secondary | ICD-10-CM | POA: Diagnosis not present

## 2017-07-17 DIAGNOSIS — M79672 Pain in left foot: Secondary | ICD-10-CM | POA: Diagnosis not present

## 2017-07-17 DIAGNOSIS — M7672 Peroneal tendinitis, left leg: Secondary | ICD-10-CM | POA: Diagnosis not present

## 2017-08-14 ENCOUNTER — Other Ambulatory Visit: Payer: Self-pay | Admitting: Podiatry

## 2017-08-14 DIAGNOSIS — M7672 Peroneal tendinitis, left leg: Secondary | ICD-10-CM

## 2017-08-14 DIAGNOSIS — M79672 Pain in left foot: Secondary | ICD-10-CM | POA: Diagnosis not present

## 2017-08-17 DIAGNOSIS — H9201 Otalgia, right ear: Secondary | ICD-10-CM | POA: Diagnosis not present

## 2017-08-17 DIAGNOSIS — R51 Headache: Secondary | ICD-10-CM | POA: Diagnosis not present

## 2017-08-18 DIAGNOSIS — B029 Zoster without complications: Secondary | ICD-10-CM | POA: Diagnosis not present

## 2017-08-27 ENCOUNTER — Ambulatory Visit: Payer: 59

## 2017-09-26 DIAGNOSIS — E538 Deficiency of other specified B group vitamins: Secondary | ICD-10-CM | POA: Diagnosis not present

## 2017-09-26 DIAGNOSIS — E1165 Type 2 diabetes mellitus with hyperglycemia: Secondary | ICD-10-CM | POA: Diagnosis not present

## 2017-10-03 DIAGNOSIS — E1165 Type 2 diabetes mellitus with hyperglycemia: Secondary | ICD-10-CM | POA: Diagnosis not present

## 2017-10-03 DIAGNOSIS — E1142 Type 2 diabetes mellitus with diabetic polyneuropathy: Secondary | ICD-10-CM | POA: Diagnosis not present

## 2017-10-03 DIAGNOSIS — E538 Deficiency of other specified B group vitamins: Secondary | ICD-10-CM | POA: Diagnosis not present

## 2017-11-13 DIAGNOSIS — L03115 Cellulitis of right lower limb: Secondary | ICD-10-CM | POA: Diagnosis not present

## 2017-11-13 DIAGNOSIS — E11621 Type 2 diabetes mellitus with foot ulcer: Secondary | ICD-10-CM | POA: Diagnosis not present

## 2017-11-13 DIAGNOSIS — L97519 Non-pressure chronic ulcer of other part of right foot with unspecified severity: Secondary | ICD-10-CM | POA: Diagnosis not present

## 2017-11-18 DIAGNOSIS — L97512 Non-pressure chronic ulcer of other part of right foot with fat layer exposed: Secondary | ICD-10-CM | POA: Diagnosis not present

## 2017-11-18 DIAGNOSIS — L97509 Non-pressure chronic ulcer of other part of unspecified foot with unspecified severity: Secondary | ICD-10-CM | POA: Diagnosis not present

## 2017-11-18 DIAGNOSIS — E11621 Type 2 diabetes mellitus with foot ulcer: Secondary | ICD-10-CM | POA: Diagnosis not present

## 2017-11-25 DIAGNOSIS — L97512 Non-pressure chronic ulcer of other part of right foot with fat layer exposed: Secondary | ICD-10-CM | POA: Diagnosis not present

## 2017-11-25 DIAGNOSIS — L97509 Non-pressure chronic ulcer of other part of unspecified foot with unspecified severity: Secondary | ICD-10-CM | POA: Diagnosis not present

## 2017-11-25 DIAGNOSIS — E11621 Type 2 diabetes mellitus with foot ulcer: Secondary | ICD-10-CM | POA: Diagnosis not present

## 2017-12-09 DIAGNOSIS — E11621 Type 2 diabetes mellitus with foot ulcer: Secondary | ICD-10-CM | POA: Diagnosis not present

## 2017-12-09 DIAGNOSIS — L97509 Non-pressure chronic ulcer of other part of unspecified foot with unspecified severity: Secondary | ICD-10-CM | POA: Diagnosis not present

## 2017-12-09 DIAGNOSIS — L97512 Non-pressure chronic ulcer of other part of right foot with fat layer exposed: Secondary | ICD-10-CM | POA: Diagnosis not present

## 2017-12-09 DIAGNOSIS — M7989 Other specified soft tissue disorders: Secondary | ICD-10-CM | POA: Diagnosis not present

## 2017-12-09 DIAGNOSIS — M79671 Pain in right foot: Secondary | ICD-10-CM | POA: Diagnosis not present

## 2017-12-09 DIAGNOSIS — L97519 Non-pressure chronic ulcer of other part of right foot with unspecified severity: Secondary | ICD-10-CM | POA: Diagnosis not present

## 2017-12-23 DIAGNOSIS — L97512 Non-pressure chronic ulcer of other part of right foot with fat layer exposed: Secondary | ICD-10-CM | POA: Diagnosis not present

## 2017-12-23 DIAGNOSIS — L97509 Non-pressure chronic ulcer of other part of unspecified foot with unspecified severity: Secondary | ICD-10-CM | POA: Diagnosis not present

## 2017-12-23 DIAGNOSIS — E11621 Type 2 diabetes mellitus with foot ulcer: Secondary | ICD-10-CM | POA: Diagnosis not present

## 2018-01-06 DIAGNOSIS — L97512 Non-pressure chronic ulcer of other part of right foot with fat layer exposed: Secondary | ICD-10-CM | POA: Diagnosis not present

## 2018-01-06 DIAGNOSIS — E11621 Type 2 diabetes mellitus with foot ulcer: Secondary | ICD-10-CM | POA: Diagnosis not present

## 2018-01-06 DIAGNOSIS — L97509 Non-pressure chronic ulcer of other part of unspecified foot with unspecified severity: Secondary | ICD-10-CM | POA: Diagnosis not present

## 2018-04-03 DIAGNOSIS — E538 Deficiency of other specified B group vitamins: Secondary | ICD-10-CM | POA: Diagnosis not present

## 2018-04-03 DIAGNOSIS — E1165 Type 2 diabetes mellitus with hyperglycemia: Secondary | ICD-10-CM | POA: Diagnosis not present

## 2018-04-09 DIAGNOSIS — L97512 Non-pressure chronic ulcer of other part of right foot with fat layer exposed: Secondary | ICD-10-CM | POA: Diagnosis not present

## 2018-04-09 DIAGNOSIS — E11621 Type 2 diabetes mellitus with foot ulcer: Secondary | ICD-10-CM | POA: Diagnosis not present

## 2018-04-09 DIAGNOSIS — M86171 Other acute osteomyelitis, right ankle and foot: Secondary | ICD-10-CM | POA: Diagnosis not present

## 2018-04-10 ENCOUNTER — Other Ambulatory Visit: Payer: Self-pay | Admitting: Podiatry

## 2018-04-10 DIAGNOSIS — M86171 Other acute osteomyelitis, right ankle and foot: Secondary | ICD-10-CM | POA: Diagnosis not present

## 2018-04-10 DIAGNOSIS — E1142 Type 2 diabetes mellitus with diabetic polyneuropathy: Secondary | ICD-10-CM | POA: Diagnosis not present

## 2018-04-10 DIAGNOSIS — E11621 Type 2 diabetes mellitus with foot ulcer: Secondary | ICD-10-CM | POA: Diagnosis not present

## 2018-04-13 ENCOUNTER — Telehealth: Payer: Self-pay | Admitting: Licensed Clinical Social Worker

## 2018-04-13 ENCOUNTER — Other Ambulatory Visit: Payer: Self-pay

## 2018-04-13 ENCOUNTER — Encounter: Payer: Self-pay | Admitting: *Deleted

## 2018-04-13 NOTE — Telephone Encounter (Signed)
Referral requested from Dr. Orland Jarredroxler for wound on foot. I called and left message on the patients voicemail to call for an appointment.

## 2018-04-15 ENCOUNTER — Encounter
Admission: RE | Disposition: A | Discharge status: Home / Self Care | Payer: Self-pay | Source: Home / Self Care | Attending: Podiatry

## 2018-04-15 ENCOUNTER — Ambulatory Visit: Payer: 59 | Admitting: Anesthesiology

## 2018-04-15 ENCOUNTER — Ambulatory Visit
Admission: RE | Admit: 2018-04-15 | Discharge: 2018-04-15 | Disposition: A | Discharge status: Home / Self Care | Payer: 59 | Attending: Podiatry | Admitting: Podiatry

## 2018-04-15 DIAGNOSIS — Z886 Allergy status to analgesic agent status: Secondary | ICD-10-CM | POA: Diagnosis not present

## 2018-04-15 DIAGNOSIS — I10 Essential (primary) hypertension: Secondary | ICD-10-CM | POA: Diagnosis not present

## 2018-04-15 DIAGNOSIS — E11621 Type 2 diabetes mellitus with foot ulcer: Secondary | ICD-10-CM | POA: Diagnosis not present

## 2018-04-15 DIAGNOSIS — Z888 Allergy status to other drugs, medicaments and biological substances status: Secondary | ICD-10-CM | POA: Diagnosis not present

## 2018-04-15 DIAGNOSIS — Z6836 Body mass index (BMI) 36.0-36.9, adult: Secondary | ICD-10-CM | POA: Insufficient documentation

## 2018-04-15 DIAGNOSIS — Z87891 Personal history of nicotine dependence: Secondary | ICD-10-CM | POA: Insufficient documentation

## 2018-04-15 DIAGNOSIS — Z79899 Other long term (current) drug therapy: Secondary | ICD-10-CM | POA: Diagnosis not present

## 2018-04-15 DIAGNOSIS — Z7689 Persons encountering health services in other specified circumstances: Secondary | ICD-10-CM | POA: Diagnosis not present

## 2018-04-15 DIAGNOSIS — M86171 Other acute osteomyelitis, right ankle and foot: Secondary | ICD-10-CM | POA: Diagnosis present

## 2018-04-15 DIAGNOSIS — Z7984 Long term (current) use of oral hypoglycemic drugs: Secondary | ICD-10-CM | POA: Insufficient documentation

## 2018-04-15 DIAGNOSIS — L97512 Non-pressure chronic ulcer of other part of right foot with fat layer exposed: Secondary | ICD-10-CM | POA: Insufficient documentation

## 2018-04-15 DIAGNOSIS — Z6837 Body mass index (BMI) 37.0-37.9, adult: Secondary | ICD-10-CM | POA: Diagnosis not present

## 2018-04-15 DIAGNOSIS — Z880 Allergy status to penicillin: Secondary | ICD-10-CM | POA: Diagnosis not present

## 2018-04-15 HISTORY — DX: Polyneuropathy, unspecified: G62.9

## 2018-04-15 HISTORY — DX: Essential (primary) hypertension: I10

## 2018-04-15 HISTORY — PX: AMPUTATION TOE: SHX6595

## 2018-04-15 HISTORY — DX: Type 2 diabetes mellitus without complications: E11.9

## 2018-04-15 LAB — GLUCOSE, CAPILLARY
Glucose-Capillary: 105 mg/dL — ABNORMAL HIGH (ref 70–99)
Glucose-Capillary: 102 mg/dL — ABNORMAL HIGH (ref 70–99)

## 2018-04-15 SURGERY — AMPUTATION, TOE
Anesthesia: General | Site: Toe | Laterality: Right

## 2018-04-15 MED ORDER — ONDANSETRON HCL 4 MG/2ML IJ SOLN
INTRAMUSCULAR | Status: DC | PRN
  Administered 2018-04-15: 4 mg via INTRAVENOUS

## 2018-04-15 MED ORDER — ONDANSETRON HCL 4 MG/2ML IJ SOLN: 4.0000 mg | Freq: Once | INTRAMUSCULAR | Status: DC | PRN

## 2018-04-15 MED ORDER — LIDOCAINE HCL (CARDIAC) PF 100 MG/5ML IV SOSY
PREFILLED_SYRINGE | INTRAVENOUS | Status: DC | PRN
  Administered 2018-04-15: 60 mg via INTRATRACHEAL

## 2018-04-15 MED ORDER — SCOPOLAMINE 1 MG/3DAYS TD PT72
1.0000 | MEDICATED_PATCH | Freq: Once | TRANSDERMAL | Status: DC
  Administered 2018-04-15: 1.5 mg via TRANSDERMAL

## 2018-04-15 MED ORDER — LIDOCAINE-EPINEPHRINE 1 %-1:100000 IJ SOLN
INTRAMUSCULAR | Status: DC | PRN
  Administered 2018-04-15: 10 mL
  Administered 2018-04-15: 5 mL

## 2018-04-15 MED ORDER — OXYCODONE HCL 5 MG/5ML PO SOLN: 5.0000 mg | Freq: Once | ORAL | Status: DC | PRN

## 2018-04-15 MED ORDER — MIDAZOLAM HCL 5 MG/5ML IJ SOLN
INTRAMUSCULAR | Status: DC | PRN
  Administered 2018-04-15: 2 mg via INTRAVENOUS

## 2018-04-15 MED ORDER — FENTANYL CITRATE (PF) 100 MCG/2ML IJ SOLN
INTRAMUSCULAR | Status: DC | PRN
  Administered 2018-04-15 (×2): 50 ug via INTRAVENOUS

## 2018-04-15 MED ORDER — OXYCODONE HCL 5 MG PO TABS: 5.0000 mg | ORAL_TABLET | Freq: Once | ORAL | Status: DC | PRN

## 2018-04-15 MED ORDER — FENTANYL CITRATE (PF) 100 MCG/2ML IJ SOLN: 25.0000 ug | INTRAMUSCULAR | Status: DC | PRN

## 2018-04-15 MED ORDER — ACETAMINOPHEN 10 MG/ML IV SOLN
INTRAVENOUS | Status: DC | PRN
  Administered 2018-04-15: 1000 mg via INTRAVENOUS

## 2018-04-15 MED ORDER — LIDOCAINE HCL (PF) 1 % IJ SOLN
INTRAMUSCULAR | Status: DC | PRN
  Administered 2018-04-15: 5 mL

## 2018-04-15 MED ORDER — POVIDONE-IODINE 7.5 % EX SOLN
Freq: Once | CUTANEOUS | Status: AC
  Administered 2018-04-15: 12:00:00 via TOPICAL

## 2018-04-15 MED ORDER — HYDROCODONE-ACETAMINOPHEN 5-325 MG PO TABS: 1.0000 | ORAL_TABLET | Freq: Four times a day (QID) | ORAL | 0 refills | Status: DC | PRN

## 2018-04-15 MED ORDER — PROPOFOL 10 MG/ML IV BOLUS
INTRAVENOUS | Status: DC | PRN
  Administered 2018-04-15: 200 mg via INTRAVENOUS

## 2018-04-15 MED ORDER — ACETAMINOPHEN 10 MG/ML IV SOLN: 1000.0000 mg | Freq: Once | INTRAVENOUS | Status: DC | PRN

## 2018-04-15 MED ORDER — LACTATED RINGERS IV SOLN: INTRAVENOUS | Status: DC

## 2018-04-15 MED ORDER — LACTATED RINGERS IV SOLN
INTRAVENOUS | Status: DC
  Administered 2018-04-15 (×2): via INTRAVENOUS

## 2018-04-15 MED ORDER — BUPIVACAINE HCL (PF) 0.5 % IJ SOLN
INTRAMUSCULAR | Status: DC | PRN
  Administered 2018-04-15: 5 mL

## 2018-04-15 MED ORDER — CLINDAMYCIN PHOSPHATE 900 MG/50ML IV SOLN
900.0000 mg | INTRAVENOUS | Status: AC
  Administered 2018-04-15: 900 mg via INTRAVENOUS

## 2018-04-15 SURGICAL SUPPLY — 40 items
BANDAGE ELASTIC 4 LF NS (GAUZE/BANDAGES/DRESSINGS) ×2 IMPLANT
BENZOIN TINCTURE PRP APPL 2/3 (GAUZE/BANDAGES/DRESSINGS) ×2 IMPLANT
BLADE MED AGGRESSIVE (BLADE) IMPLANT
BLADE OSC/SAGITTAL 5.5X25 (BLADE) ×1 IMPLANT
BLADE OSC/SAGITTAL MD 5.5X18 (BLADE) IMPLANT
BNDG COHESIVE 4X5 TAN STRL (GAUZE/BANDAGES/DRESSINGS) ×2 IMPLANT
BNDG ESMARK 6X12 TAN STRL LF (GAUZE/BANDAGES/DRESSINGS) ×2 IMPLANT
BNDG GAUZE 4.5X4.1 6PLY STRL (MISCELLANEOUS) ×2 IMPLANT
BNDG STRETCH 4X75 STRL LF (GAUZE/BANDAGES/DRESSINGS) ×2 IMPLANT
CANISTER SUCT 1200ML W/VALVE (MISCELLANEOUS) ×2 IMPLANT
CUFF TOURN SGL QUICK 18 (TOURNIQUET CUFF) IMPLANT
DURAPREP 26ML APPLICATOR (WOUND CARE) ×2 IMPLANT
ELECT REM PT RETURN 9FT ADLT (ELECTROSURGICAL) ×2
ELECTRODE REM PT RTRN 9FT ADLT (ELECTROSURGICAL) ×1 IMPLANT
GAUZE PETRO XEROFOAM 1X8 (MISCELLANEOUS) ×2 IMPLANT
GAUZE SPONGE 4X4 12PLY STRL (GAUZE/BANDAGES/DRESSINGS) ×2 IMPLANT
GLOVE BIO SURGEON STRL SZ7.5 (GLOVE) ×2 IMPLANT
GLOVE INDICATOR 8.0 STRL GRN (GLOVE) ×2 IMPLANT
GOWN STRL REUS W/ TWL LRG LVL3 (GOWN DISPOSABLE) ×2 IMPLANT
GOWN STRL REUS W/TWL LRG LVL3 (GOWN DISPOSABLE) ×2
KIT TURNOVER KIT A (KITS) ×2 IMPLANT
NDL HYPO 18GX1.5 BLUNT FILL (NEEDLE) IMPLANT
NDL HYPO 25GX1X1/2 BEV (NEEDLE) IMPLANT
NEEDLE HYPO 18GX1.5 BLUNT FILL (NEEDLE) IMPLANT
NEEDLE HYPO 25GX1X1/2 BEV (NEEDLE) IMPLANT
PACK EXTREMITY ARMC (MISCELLANEOUS) ×2 IMPLANT
PENCIL SMOKE EVACUATOR (MISCELLANEOUS) ×2 IMPLANT
RASP SM TEAR CROSS CUT (RASP) ×2 IMPLANT
STOCKINETTE IMPERVIOUS LG (DRAPES) ×2 IMPLANT
STRIP CLOSURE SKIN 1/4X4 (GAUZE/BANDAGES/DRESSINGS) ×2 IMPLANT
SUT ETHILON 4-0 (SUTURE) ×1
SUT ETHILON 4-0 FS2 18XMFL BLK (SUTURE) ×1
SUT ETHILON 5-0 FS-2 18 BLK (SUTURE) IMPLANT
SUT VIC AB 2-0 SH 27 (SUTURE)
SUT VIC AB 2-0 SH 27XBRD (SUTURE) IMPLANT
SUT VIC AB 3-0 SH 27 (SUTURE)
SUT VIC AB 3-0 SH 27X BRD (SUTURE) IMPLANT
SUT VIC AB 4-0 FS2 27 (SUTURE) IMPLANT
SUTURE ETHLN 4-0 FS2 18XMF BLK (SUTURE) IMPLANT
SYR 10ML LL (SYRINGE) IMPLANT

## 2018-04-15 NOTE — Discharge Instructions (Signed)
[facility] °[facility] ° °POST OPERATIVE INSTRUCTIONS FOR DR. TROXLER AND DR. FOWLER °[facility] ° ° °1. Take your medication as prescribed.  Pain medication should be taken only as needed. ° °2. Keep the dressing clean, dry and intact. ° °3. Keep your foot elevated above the heart level for the first 48 hours. ° °4. Walking to the bathroom and brief periods of walking are acceptable, unless we have instructed you to be non-weight bearing. ° °5. Always wear your post-op shoe when walking.  Always use your crutches if you are to be non-weight bearing. ° °6. Do not take a shower. Baths are permissible as long as the foot is kept out of the water.  ° °7. Every hour you are awake:  °- Bend your knee 15 times. °- Flex foot 15 times °- Massage calf 15 times ° °8. Call [facility] ([phone]) if any of the following problems occur: °- You develop a temperature or fever. °- The bandage becomes saturated with blood. °- Medication does not stop your pain. °- Injury of the foot occurs. °- Any symptoms of infection including redness, odor, or red streaks running from wound. ° ° °General Anesthesia, Adult, Care After °This sheet gives you information about how to care for yourself after your procedure. Your health care provider may also give you more specific instructions. If you have problems or questions, contact your health care provider. °What can I expect after the procedure? °After the procedure, the following side effects are common: °· Pain or discomfort at the IV site. °· Nausea. °· Vomiting. °· Sore throat. °· Trouble concentrating. °· Feeling cold or chills. °· Weak or tired. °· Sleepiness and fatigue. °· Soreness and body aches. These side effects can affect parts of the body that were not involved in surgery. °Follow these instructions at home: ° °For at least 24 hours after the procedure: °· Have a responsible adult stay with you. It is important to  have someone help care for you until you are awake and alert. °· Rest as needed. °· Do not: °? Participate in activities in which you could fall or become injured. °? Drive. °? Use heavy machinery. °? Drink alcohol. °? Take sleeping pills or medicines that cause drowsiness. °? Make important decisions or sign legal documents. °? Take care of children on your own. °Eating and drinking °· Follow any instructions from your health care provider about eating or drinking restrictions. °· When you feel hungry, start by eating small amounts of foods that are soft and easy to digest (bland), such as toast. Gradually return to your regular diet. °· Drink enough fluid to keep your urine pale yellow. °· If you vomit, rehydrate by drinking water, juice, or clear broth. °General instructions °· If you have sleep apnea, surgery and certain medicines can increase your risk for breathing problems. Follow instructions from your health care provider about wearing your sleep device: °? Anytime you are sleeping, including during daytime naps. °? While taking prescription pain medicines, sleeping medicines, or medicines that make you drowsy. °· Return to your normal activities as told by your health care provider. Ask your health care provider what activities are safe for you. °· Take over-the-counter and prescription medicines only as told by your health care provider. °· If you smoke, do not smoke without supervision. °· Keep all follow-up visits as told by your health care provider. This is important. °Contact a health care provider if: °· You have   nausea or vomiting that does not get better with medicine. °· You cannot eat or drink without vomiting. °· You have pain that does not get better with medicine. °· You are unable to pass urine. °· You develop a skin rash. °· You have a fever. °· You have redness around your IV site that gets worse. °Get help right away if: °· You have difficulty breathing. °· You have chest pain. °· You  have blood in your urine or stool, or you vomit blood. °Summary °· After the procedure, it is common to have a sore throat or nausea. It is also common to feel tired. °· Have a responsible adult stay with you for the first 24 hours after general anesthesia. It is important to have someone help care for you until you are awake and alert. °· When you feel hungry, start by eating small amounts of foods that are soft and easy to digest (bland), such as toast. Gradually return to your regular diet. °· Drink enough fluid to keep your urine pale yellow. °· Return to your normal activities as told by your health care provider. Ask your health care provider what activities are safe for you. °This information is not intended to replace advice given to you by your health care provider. Make sure you discuss any questions you have with your health care provider. °Document Released: 06/10/2000 Document Revised: 10/18/2016 Document Reviewed: 10/18/2016 °Elsevier Interactive Patient Education © 2019 [employer] ° °

## 2018-04-15 NOTE — Anesthesia Postprocedure Evaluation (Signed)
Anesthesia Post Note  Patient: [name]  Procedure(s) Performed: RAY RIGHT 5TH (Right Toe)  Patient location during evaluation: PACU Anesthesia Type: General Level of consciousness: awake Pain management: pain level controlled Vital Signs Assessment: post-procedure vital signs reviewed and stable Respiratory status: respiratory function stable Cardiovascular status: stable Postop Assessment: no signs of nausea or vomiting Anesthetic complications: no    Lori Calhoun

## 2018-04-15 NOTE — Transfer of Care (Signed)
Immediate Anesthesia Transfer of Care Note  Patient: Lori Calhoun  Procedure(s) Performed: RAY RIGHT 5TH (Right Toe)  Patient Location: PACU  Anesthesia Type: General  Level of Consciousness: awake, alert  and patient cooperative  Airway and Oxygen Therapy: Patient Spontanous Breathing and Patient connected to supplemental oxygen  Post-op Assessment: Post-op Vital signs reviewed, Patient's Cardiovascular Status Stable, Respiratory Function Stable, Patent Airway and No signs of Nausea or vomiting  Post-op Vital Signs: Reviewed and stable  Complications: No apparent anesthesia complications

## 2018-04-15 NOTE — Anesthesia Preprocedure Evaluation (Signed)
Anesthesia Evaluation  Patient identified by MRN, date of birth, ID band Patient awake    Reviewed: Allergy & Precautions, NPO status , Patient's Chart, lab work & pertinent test results  Airway Mallampati: II  TM Distance: >3 FB     Dental   Pulmonary former smoker,    breath sounds clear to auscultation       Cardiovascular hypertension,  Rhythm:Regular Rate:Normal     Neuro/Psych PSYCHIATRIC DISORDERS    GI/Hepatic   Endo/Other  diabetes, Type 2Obesity - BMI 37  Renal/GU      Musculoskeletal   Abdominal   Peds  Hematology   Anesthesia Other Findings   Reproductive/Obstetrics                             Anesthesia Physical Anesthesia Plan  ASA: III  Anesthesia Plan: General   Post-op Pain Management:    Induction: Intravenous  PONV Risk Score and Plan: 3 and Dexamethasone, Ondansetron and Scopolamine patch - Pre-op  Airway Management Planned: LMA  Additional Equipment:   Intra-op Plan:   Post-operative Plan:   Informed Consent: I have reviewed the patients History and Physical, chart, labs and discussed the procedure including the risks, benefits and alternatives for the proposed anesthesia with the patient or authorized representative who has indicated his/her understanding and acceptance.     Dental advisory given  Plan Discussed with: CRNA  Anesthesia Plan Comments:        Anesthesia Quick Evaluation

## 2018-04-15 NOTE — H&P (Signed)
HISTORY AND PHYSICAL INTERVAL NOTE:  04/15/2018  12:08 PM  Lori Calhoun  has presented today for surgery, with the diagnosis of L97.512  SKIN ULCER RIGHT FOOT WITH FAT LAYER EXPOSED.  The various methods of treatment have been discussed with the patient.  No guarantees were given.  After consideration of risks, benefits and other options for treatment, the patient has consented to surgery.  I have reviewed the patients' chart and labs.     A history and physical examination was performed in my office.  The patient was reexamined.  There have been no changes to this history and physical examination.  Gwyneth Revels A

## 2018-04-15 NOTE — Op Note (Signed)
Operative note   Surgeon:Theta Leaf [employer] logistics/support/administrative officerowler    Assistant: None    Preop diagnosis: Osteomyelitis right fifth metatarsal and joint.  Full-thickness ulcer right fifth metatarsal phalangeal joint region    Postop diagnosis: Same    Procedure: 1.  Partial fifth ray amputation right foot 2.  Local fillet flap for primary wound closure right fifth metatarsal ulceration    EBL: Minimal    Anesthesia:local and general.  Local consisted of a total of 5 mL's of 0.5% bupivacaine.  5 mL's of 1% lidocaine plain, and 15 mL's of 1% lidocaine with epinephrine    Hemostasis: Ankle tourniquet inflated to 200 mmHg for approximately 15 minutes    Specimen: Bone for culture and fifth ray for pathology    Complications: None    Operative indications:Lori Calhoun is an 48 y.o. that presents today for surgical intervention.  The risks/benefits/alternatives/complications have been discussed and consent has been given.    Procedure:  Patient was brought into the OR and placed on the operating table in thesupine position. After anesthesia was obtained theright lower extremity was prepped and draped in usual sterile fashion.  Attention was directed to the right foot where the large ulceration was noted overlying the right fifth MTPJ.  A full-thickness incision was carried out dorsal lateral just superior to the ulceration.  Full-thickness incision was taken down to bone.  Next an osteotomy was created along the the fifth metatarsal along the midshaft region.  The toe and fifth metatarsal was then excised from the surgical field in toto.  Bleeders were Bovie cauterized as appropriate.  At this time the large ulcerative site was full-thickness excised with a 15 blade.  Next a fillet flap was created along the dorsal skin for primary closure of the ulceration that was created.  The wound was flushed with copious amounts of irrigation.  Closure was then performed with a 3-0 and 4-0 nylon for the incision and fillet flap  closure site.  At this time a bulky sterile compression bandage was placed to the lateral aspect of the right foot.    Patient tolerated the procedure and anesthesia well.  Was transported from the OR to the PACU with all vital signs stable and vascular status intact. To be discharged per routine protocol.  Will follow up in approximately 1 week in the outpatient clinic.

## 2018-04-16 ENCOUNTER — Encounter: Payer: Self-pay | Admitting: Podiatry

## 2018-04-17 LAB — SURGICAL PATHOLOGY

## 2018-04-20 LAB — AEROBIC/ANAEROBIC CULTURE W GRAM STAIN (SURGICAL/DEEP WOUND): Gram Stain: NONE SEEN

## 2018-04-28 ENCOUNTER — Ambulatory Visit: Payer: 59 | Attending: Infectious Diseases | Admitting: Infectious Diseases

## 2018-04-28 ENCOUNTER — Encounter: Payer: Self-pay | Admitting: Infectious Diseases

## 2018-04-28 VITALS — BP 133/82 | HR 71 | Ht 72.0 in | Wt 270.0 lb

## 2018-04-28 DIAGNOSIS — L089 Local infection of the skin and subcutaneous tissue, unspecified: Secondary | ICD-10-CM

## 2018-04-28 DIAGNOSIS — E11628 Type 2 diabetes mellitus with other skin complications: Secondary | ICD-10-CM | POA: Diagnosis not present

## 2018-04-28 DIAGNOSIS — Z888 Allergy status to other drugs, medicaments and biological substances status: Secondary | ICD-10-CM

## 2018-04-28 DIAGNOSIS — E1169 Type 2 diabetes mellitus with other specified complication: Secondary | ICD-10-CM

## 2018-04-28 DIAGNOSIS — Z79899 Other long term (current) drug therapy: Secondary | ICD-10-CM

## 2018-04-28 DIAGNOSIS — M86171 Other acute osteomyelitis, right ankle and foot: Secondary | ICD-10-CM | POA: Diagnosis not present

## 2018-04-28 DIAGNOSIS — E1142 Type 2 diabetes mellitus with diabetic polyneuropathy: Secondary | ICD-10-CM

## 2018-04-28 DIAGNOSIS — B954 Other streptococcus as the cause of diseases classified elsewhere: Secondary | ICD-10-CM

## 2018-04-28 DIAGNOSIS — I10 Essential (primary) hypertension: Secondary | ICD-10-CM

## 2018-04-28 DIAGNOSIS — Z89421 Acquired absence of other right toe(s): Secondary | ICD-10-CM

## 2018-04-28 DIAGNOSIS — Z87891 Personal history of nicotine dependence: Secondary | ICD-10-CM

## 2018-04-28 DIAGNOSIS — Z880 Allergy status to penicillin: Secondary | ICD-10-CM

## 2018-04-28 NOTE — Patient Instructions (Signed)
You are here to check on your foot- you recently had a 5th met ray excision for an ulcer and the wound culture is only staph epidermidis which is a skin bacteria and the bone pathology also does not show any residual infection- your wound is healing well. You dont need any more antibiotics- you need to not put weight on that foot until Dr.Fowler clears you.use your crutches

## 2018-04-28 NOTE — Progress Notes (Signed)
NAMEBoyce Calhoun  DOB: 04/30/1970  MRN: [id number]  Date/Time: 04/28/2018 12:26 PM  REQUESTING PROVIDER: Dr. Ether Griffins Subjective:  REASON FOR CONSULT: Right foot infection ? Lori Calhoun is a 48 y.o. female with a history of diabetes mellitus, peripheral neuropathy, had recent right foot surgery and she is here for further antibiotic management. Patient developed a callus on the right foot on the lateral aspect near the fifth metatarsal a few months ago.  She works at [employer] and she is on her feet 10 hours a day and wears steel boots.  She picked on the callus and eventually it turned into an superficial ulcer.  She was followed by podiatrist Dr. Ether Griffins and the wound was being managed with topical treatment.  In January 2020 when the foot was assessed it was a full-thickness ulcer on the lateral aspect of the right foot at the fifth metatarsophalangeal joint which probes to the capsule of the bone.  Foot x-ray showed erosive changes of the distal fifth metatarsal consistent with osteomyelitis of the fifth metatarsal.  She was taken for surgery on 04/15/2018 and underwent partial fifth ray amputation of the right foot with local fillet flap for primary wound closure.  The cultures that were sent from the surgical site was Staphylococcus epidermidis in the bone pathology showed acute osteomyelitis but the margin of the resected bone was free of any acute inflammation.  She was given doxycycline by Dr. Ether Griffins and she took it for 10 days.  She does not have any fever or chills. She has been asked to not weight-bear on that foot but she does not use crutches on a regular basis. Past Medical History:  Diagnosis Date  . Diabetes mellitus, type 2 (HCC)   . Hypertension   . Peripheral neuropathy    feet  Acute pancreatitis Perirectal abscess Plantar fasciitis History of subclinical hyperthyroidism resolved Hepatic steatosis  Past Surgical History:  Procedure Laterality Date  . AMPUTATION TOE Right  04/15/2018   Procedure: RAY RIGHT 5TH;  Surgeon: Gwyneth Revels, DPM;  Location: [facility];  Service: Podiatry;  Laterality: Right;  general with local Diabetic - oral meds  . CERVICAL SPINE SURGERY     plate at V2-Y2  . LAPAROSCOPIC SUPRACERVICAL HYSTERECTOMY  2014  Right salpingectomy for hematosalpinx Social History   Socioeconomic History  . Marital status: Married    Spouse name: Not on file  . Number of children: Not on file  . Years of education: Not on file  . Highest education level: Not on file  Occupational History  . Not on file  Social Needs  . Financial resource strain: Not on file  . Food insecurity:    Worry: Not on file    Inability: Not on file  . Transportation needs:    Medical: Not on file    Non-medical: Not on file  Tobacco Use  . Smoking status: Former Smoker    Last attempt to quit: 2010    Years since quitting: 10.1  . Smokeless tobacco: Never Used  Substance and Sexual Activity  . Alcohol use: Yes    Alcohol/week: 1.0 standard drinks    Types: 1 Glasses of wine per week  . Drug use: Not on file  . Sexual activity: Not on file  Lifestyle  . Physical activity:    Days per week: Not on file    Minutes per session: Not on file  . Stress: Not on file  Relationships  . Social connections:    Talks  on phone: Not on file    Gets together: Not on file    Attends religious service: Not on file    Active member of club or organization: Not on file    Attends meetings of clubs or organizations: Not on file    Relationship status: Not on file  . Intimate partner violence:    Fear of current or ex partner: Not on file    Emotionally abused: Not on file    Physically abused: Not on file    Forced sexual activity: Not on file  Other Topics Concern  . Not on file  Social History Narrative  . Not on file    Family History  Problem Relation Age of Onset  . Breast cancer Mother 33  . Breast cancer Maternal Grandmother 60       60's    Allergies  Allergen Reactions  . Celebrex [Celecoxib] Anaphylaxis  . Penicillins Other (See Comments)    Pancreatitis  Has patient had a PCN reaction causing immediate rash, facial/tongue/throat swelling, SOB or lightheadedness with hypotension: Unknown Has patient had a PCN reaction causing severe rash involving mucus membranes or skin necrosis: Unknown Has patient had a PCN reaction that required hospitalization: Unknown Has patient had a PCN reaction occurring within the last 10 years: Unknown If all of the above answers are "NO", then may proceed with Cephalosporin use.    ? Current Outpatient Medications  Medication Sig Dispense Refill  . doxycycline (DORYX) 100 MG EC tablet Take 100 mg by mouth 2 (two) times daily.    Marland Kitchen glipiZIDE (GLUCOTROL XL) 10 MG 24 hr tablet Take 10 mg by mouth daily.  0  . INVOKANA 300 MG TABS tablet Take 300 mg by mouth daily.  1  . JENTADUETO XR 2.07-998 MG TB24 Take 1 tablet by mouth daily.  1  . losartan-hydrochlorothiazide (HYZAAR) 50-12.5 MG tablet Take 1 tablet by mouth daily.  1  . naproxen sodium (ANAPROX) 220 MG tablet Take 220 mg by mouth daily.    . pioglitazone (ACTOS) 15 MG tablet Take 15 mg by mouth daily.  1  . HYDROcodone-acetaminophen (NORCO) 5-325 MG tablet Take 1-2 tablets by mouth every 6 (six) hours as needed for moderate pain. (Patient not taking: Reported on 04/28/2018) 20 tablet 0  . potassium chloride (K-DUR) 10 MEQ tablet Take 10 mEq by mouth daily.  0   No current facility-administered medications for this visit.      Abtx:  Anti-infectives (From admission, onward)   None      REVIEW OF SYSTEMS:  Const: negative fever, negative chills, negative weight loss Eyes: negative diplopia or visual changes, negative eye pain ENT: negative coryza, negative sore throat Resp: negative cough, hemoptysis, dyspnea Cards: negative for chest pain, palpitations, lower extremity edema GU: negative for frequency, dysuria and  hematuria GI: Negative for abdominal pain, diarrhea, bleeding, constipation Skin: negative for rash and pruritus Heme: negative for easy bruising and gum/nose bleeding MS: negative for myalgias, arthralgias, back pain and muscle weakness Neurolo:negative for headaches, dizziness, vertigo, memory problems  Psych: negative for feelings of anxiety, depression  Endocrine: No polyuria or polydipsia Allergy/Immunology- negative for any medication or food allergies  Objective:  VITALS:  BP 133/82 (BP Location: Left Arm, Patient Position: Sitting, Cuff Size: Large)   Pulse 71   Ht 6' (1.829 m)   Wt 270 lb (122.5 kg)   BMI 36.62 kg/m  PHYSICAL EXAM:  General: Alert, cooperative, no distress, appears stated age.  Head:  Normocephalic, without obvious abnormality, atraumatic. Eyes: Conjunctivae clear, anicteric sclerae. Pupils are equal ENT Nares normal. No drainage or sinus tenderness. Lips, mucosa, and tongue normal. No Thrush Neck: Supple, symmetrical, no adenopathy, thyroid: non tender no carotid bruit and no JVD. Back: Did not examine Lungs: Clear to auscultation bilaterally. No Wheezing or Rhonchi. No rales. Heart: Regular rate and rhythm, no murmur, rub or gallop. Abdomen: Did not examine Extremities:rt foot dressing removed Surgical site is well coapted.  There is no discharge or redness or tenderness.  Wound is healing very well. Post surgery   Pre surgery Ulcer       Skin: No rashes or lesions. Or bruising Lymph: Cervical, supraclavicular normal. Neurologic: Grossly non-focal Pertinent Labs Lab Results CBC    Component Value Date/Time   WBC 13.1 (H) 11/03/2016 1256   RBC 4.95 11/03/2016 1256   HGB 15.3 11/03/2016 1256   HGB 14.1 08/31/2012 0947   HCT 44.5 11/03/2016 1256   HCT 40.9 08/31/2012 0947   PLT 175 11/03/2016 1256   PLT 110 (L) 08/31/2012 0947   MCV 89.8 11/03/2016 1256   MCV 90 08/31/2012 0947   MCH 30.9 11/03/2016 1256   MCHC 34.4 11/03/2016 1256    RDW 14.0 11/03/2016 1256   RDW 14.7 (H) 08/31/2012 0947   LYMPHSABS 3.0 08/31/2012 0947   MONOABS 0.7 08/31/2012 0947   EOSABS 0.2 08/31/2012 0947   BASOSABS 0.1 08/31/2012 0947    CMP Latest Ref Rng & Units 11/03/2016 08/29/2011  Glucose 65 - 99 mg/dL 161(W151(H) 960(A170(H)  BUN 6 - 20 mg/dL 12 13  Creatinine 5.400.44 - 1.00 mg/dL 9.810.92 1.911.22  Sodium 478135 - 145 mmol/L 139 139  Potassium 3.5 - 5.1 mmol/L 3.8 3.8  Chloride 101 - 111 mmol/L 106 107  CO2 22 - 32 mmol/L 26 21  Calcium 8.9 - 10.3 mg/dL 2.9(F8.7(L) 9.0  Total Protein 6.5 - 8.1 g/dL 7.7 -  Total Bilirubin 0.3 - 1.2 mg/dL 0.8 -  Alkaline Phos 38 - 126 U/L 65 -  AST 15 - 41 U/L 19 -  ALT 14 - 54 U/L 18 -      Microbiology: 04/10/2018 culture reviewed IMAGING RESULTS: No images to review ? Impression/Recommendation ?48 y.o. female with a history of diabetes mellitus, peripheral neuropathy, had recent right foot surgery and she is here for further antibiotic management. ? Right foot ulcer s/p fifth  partial ray amputation of the right foot.  There was osteomyelitis on the bone but the margin of the bone is viable and clear of osteo-.  So hence it is a therapeutic amputation.  The wound also has healed very well.  The the Gram stain did not show any WBC or bacteria and the bone culture was positive for staph epidermidis.  As she had the ulcer and it was exposing the bone , the staph epidermidis a skin bacteria  was contaminating the bone.  Doubt this was a true pathogen.  She received 10 days of doxycycline.  Even though the staph epi was resistant to Doxy the wound has healed very well. I do not think she needs any further antibiotics as she would not benefit from it now.  The most important thing is for her to  not  weight-bear on the foot.  Diabetes mellitus on multiple medications. Hypertension on losartan hydrochlorothiazide ___________________________________________________ Discussed the management with the patient and her friend who  accompanied her to the visit. Discussed the management plan with Dr. Ether GriffinsFowler Note:  This document was  prepared using Systems analyst and may include unintentional dictation errors.

## 2018-09-04 ENCOUNTER — Other Ambulatory Visit: Payer: Self-pay | Admitting: Student

## 2018-09-04 DIAGNOSIS — M5412 Radiculopathy, cervical region: Secondary | ICD-10-CM

## 2018-09-23 ENCOUNTER — Ambulatory Visit: Payer: 59

## 2020-02-17 ENCOUNTER — Other Ambulatory Visit: Payer: Self-pay | Admitting: Nurse Practitioner

## 2020-02-17 DIAGNOSIS — M542 Cervicalgia: Secondary | ICD-10-CM

## 2020-02-29 ENCOUNTER — Ambulatory Visit
Admission: RE | Admit: 2020-02-29 | Discharge: 2020-02-29 | Disposition: A | Discharge status: Home / Self Care | Payer: BC Managed Care – PPO | Source: Ambulatory Visit | Attending: Nurse Practitioner | Admitting: Nurse Practitioner

## 2020-02-29 ENCOUNTER — Other Ambulatory Visit: Payer: Self-pay

## 2020-02-29 DIAGNOSIS — M542 Cervicalgia: Secondary | ICD-10-CM | POA: Diagnosis not present

## 2020-02-29 IMAGING — MR MR CERVICAL SPINE W/O CM
5 series · 37 of 48 positions shown · non-contrast
Comparison: None.

CLINICAL DATA: Neck pain, left shoulder pain, and left arm numbness
and tingling. Prior cervical fusion.

EXAM:
MRI CERVICAL SPINE WITHOUT CONTRAST
TECHNIQUE: Multiplanar, multisequence MR imaging of the cervical spine was
performed. No intravenous contrast was administered.

[Series 5: T2 · sagittal · 3.0mm · 0.62mm/px · 6 of 15 slices shown (1 of 2)]
[im 1/15]
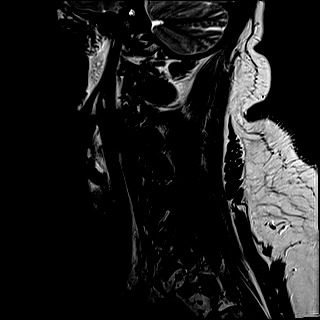
[im 3/15]
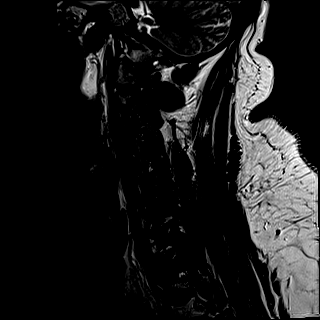
[im 6/15]
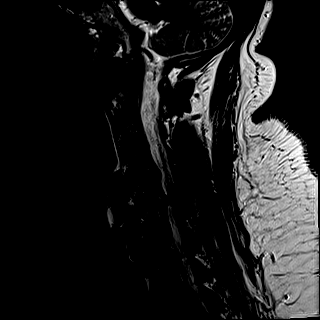
[im 9/15]
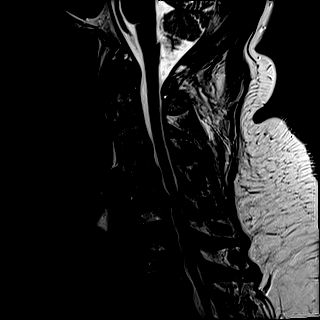
[im 12/15]
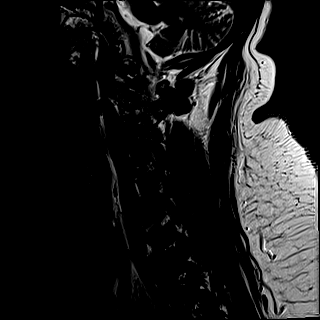
[im 15/15]
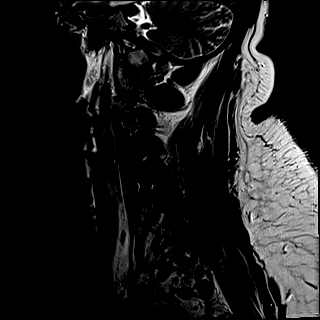

[Series 6: FLAIR · sagittal · 3.0mm · 0.78mm/px · 7 of 15 slices shown]
[im 1/15]
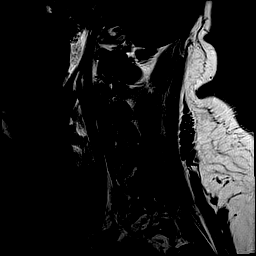
[im 3/15]
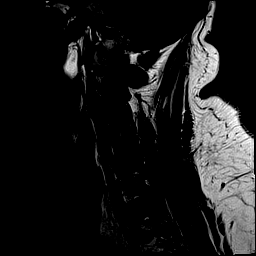
[im 5/15]
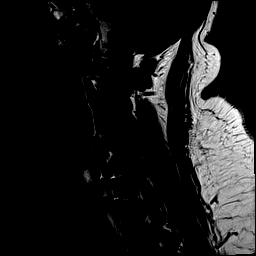
[im 8/15]
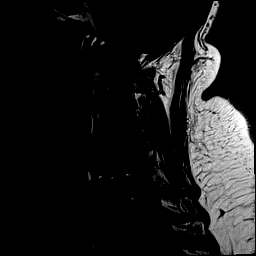
[im 10/15]
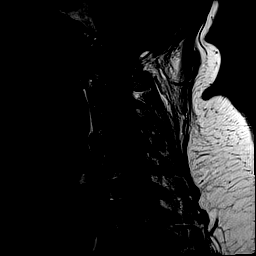
[im 12/15]
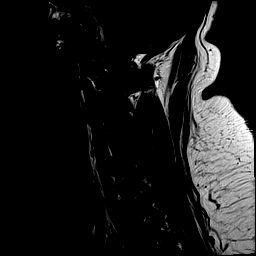
[im 15/15]
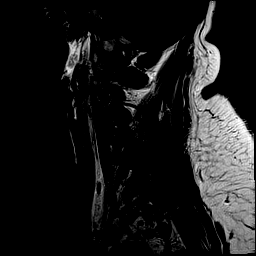

[Series 7: STIR · sagittal · 3.0mm · 0.62mm/px · 7 of 15 slices shown]
[im 1/15]
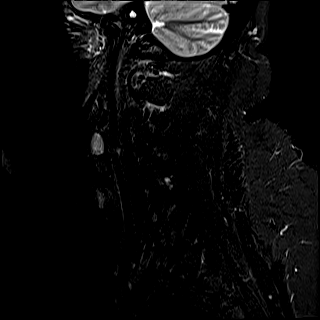
[im 3/15]
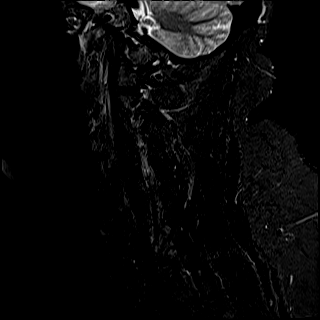
[im 5/15]
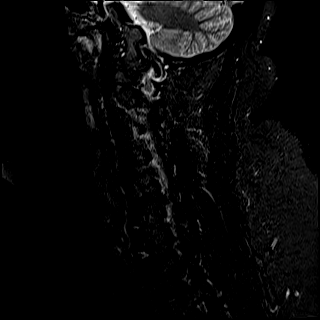
[im 8/15]
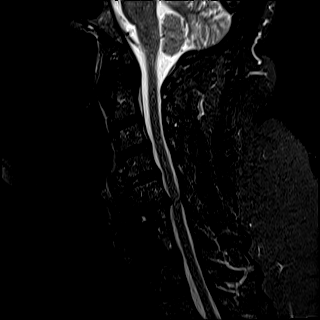
[im 10/15]
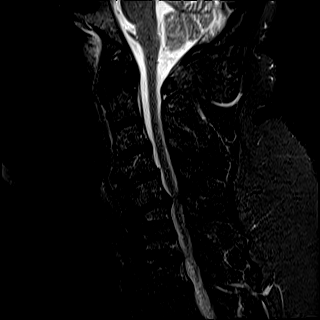
[im 12/15]
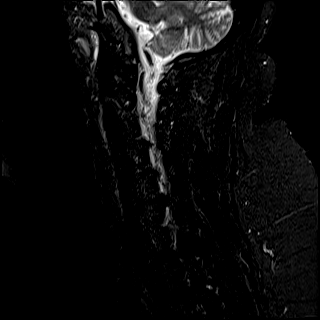
[im 15/15]
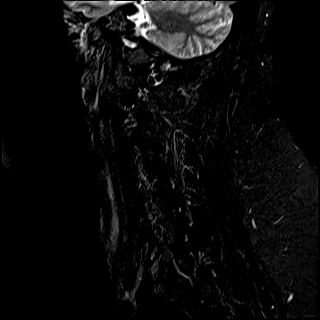

[Series 8: T2 · axial · 3.0mm · 0.70mm/px · z∈[-152,-49]mm · 9 of 31 slices shown (2 of 2)]
[im 1/31]
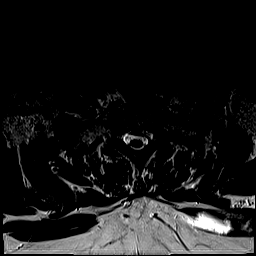
[im 3/31]
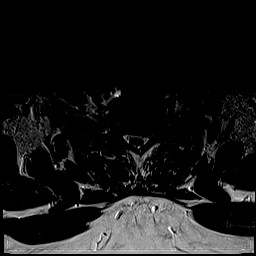
[im 5/31]
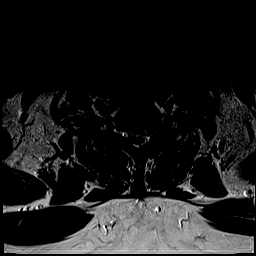
[im 10/31]
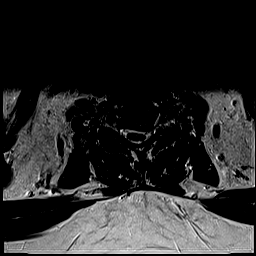
[im 14/31]
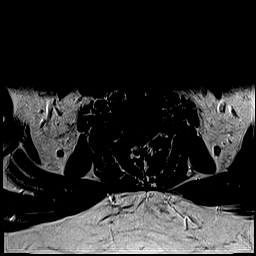
[im 17/31]
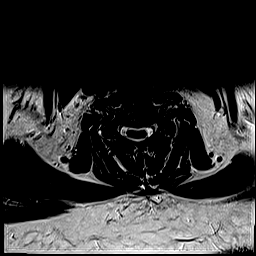
[im 21/31]
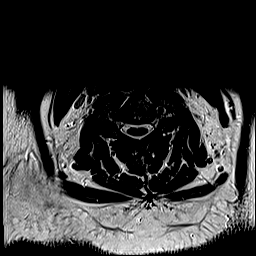
[im 26/31]
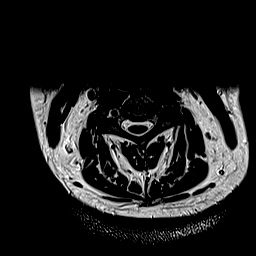
[im 31/31]
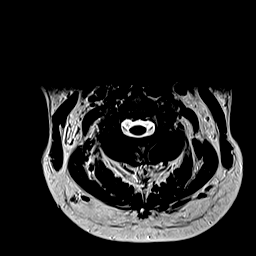

[Series 9: ax mpgr · axial · 3.0mm · 0.35mm/px · z∈[-152,-49]mm · 8 of 31 slices shown]
[im 1/31]
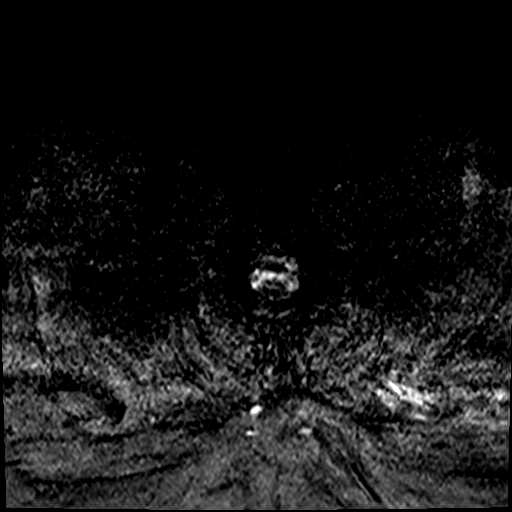
[im 5/31]
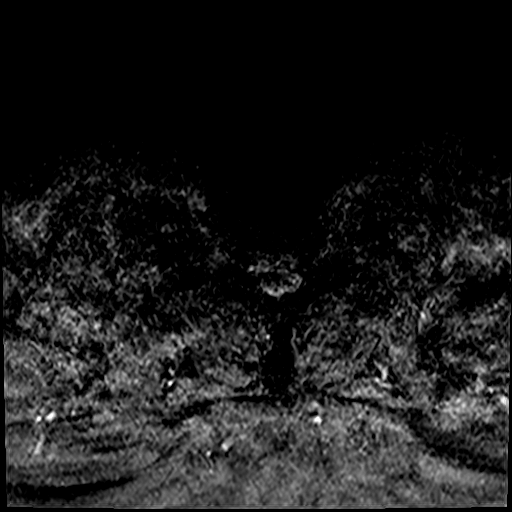
[im 10/31]
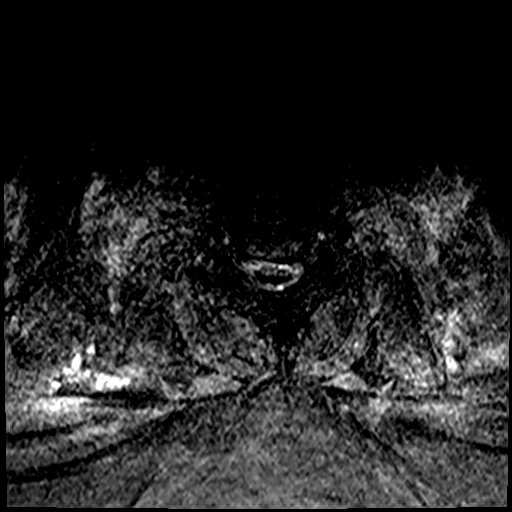
[im 14/31]
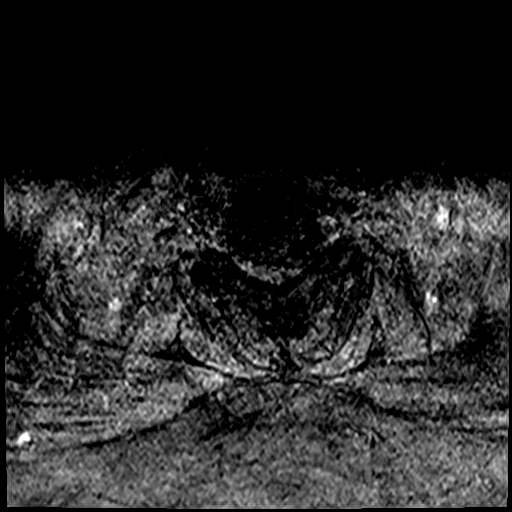
[im 17/31]
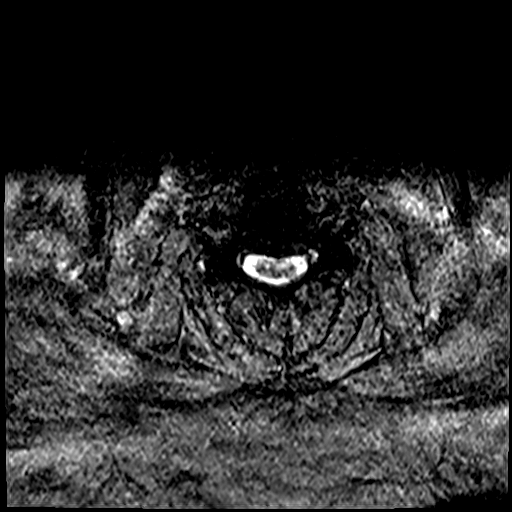
[im 21/31]
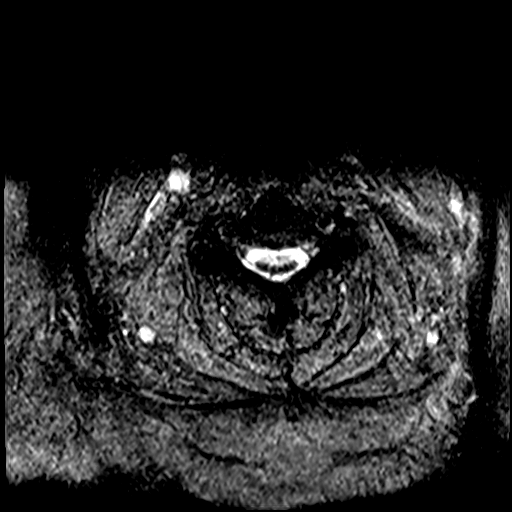
[im 26/31]
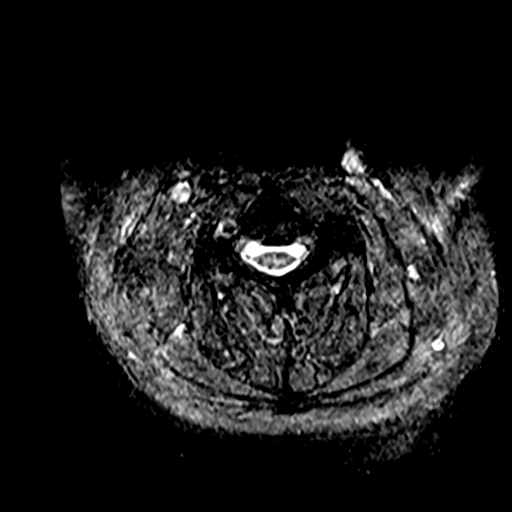
[im 31/31]
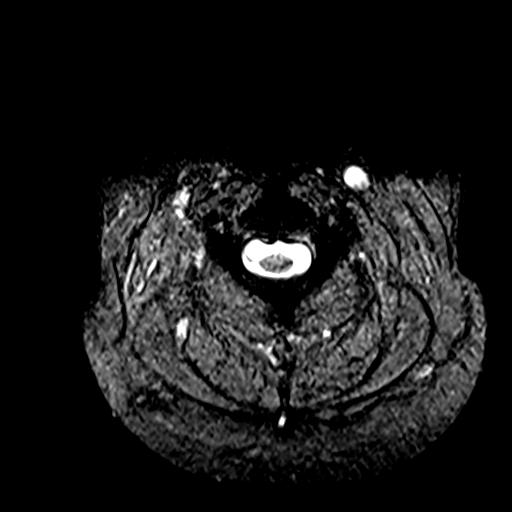

[37 of 48 positions shown; findings below may reference images not displayed]

FINDINGS: Alignment: Cervical spine straightening. Trace retrolisthesis of C7
on T1.

Vertebrae: No fracture, suspicious osseous lesion, or significant
marrow edema. C6-7 ACDF with evidence of solid fusion. Mild chronic
degenerative endplate changes and mild disc space narrowing at C5-6
and C7-T1.

Cord: Normal signal.

Posterior Fossa, vertebral arteries, paraspinal tissues:
Unremarkable.

Disc levels:

C2-3: Minimal uncovertebral spurring and mild-to-moderate right and
mild left facet arthrosis without stenosis.

C3-4: Disc bulging, a broad central disc protrusion, left
uncovertebral spurring, and mild right and moderate to severe left
facet arthrosis result in mild spinal stenosis and moderate left
neural foraminal stenosis with potential left C4 nerve root
impingement.

C4-5: Disc bulging, mild uncovertebral spurring, and mild facet
arthrosis result in mild spinal stenosis without significant neural
foraminal stenosis.

C5-6: Disc bulging, a broad left paracentral to foraminal disc
protrusion, uncovertebral spurring, and mild facet arthrosis result
in moderate to severe spinal stenosis with moderate cord flattening
and severe left greater than right neural foraminal stenosis with
potential bilateral C6 nerve root impingement.

C6-7: ACDF. Patent spinal canal. At most mild residual bilateral
neural foraminal narrowing due to spurring.

C7-T1: Disc bulging, a small central disc extrusion, a left
paracentral to foraminal disc protrusion, uncovertebral spurring,
and mild right facet arthrosis result in mild spinal stenosis and
mild-to-moderate right and moderate to severe left neural foraminal
stenosis with potential left C8 nerve root impingement.

T1-2: Only imaged sagittally. Small central to left paracentral disc
protrusion without evidence of significant stenosis.
IMPRESSION: 1. C6-7 ACDF without significant residual stenosis.
2. Multilevel disc and facet degeneration elsewhere, most notable at
C5-6 where there is moderate to severe spinal stenosis and severe
bilateral neural foraminal stenosis.
3. Severe left foraminal stenosis at C7-T1.
4. Mild spinal stenosis elsewhere.

## 2020-03-18 HISTORY — PX: OTHER SURGICAL HISTORY: SHX169

## 2021-01-26 ENCOUNTER — Other Ambulatory Visit: Payer: Self-pay | Admitting: Internal Medicine

## 2021-01-26 DIAGNOSIS — Z1231 Encounter for screening mammogram for malignant neoplasm of breast: Secondary | ICD-10-CM

## 2021-04-26 ENCOUNTER — Other Ambulatory Visit: Payer: Self-pay | Admitting: Physical Medicine & Rehabilitation

## 2021-04-26 ENCOUNTER — Other Ambulatory Visit (HOSPITAL_COMMUNITY): Payer: Self-pay | Admitting: Physical Medicine & Rehabilitation

## 2021-04-26 DIAGNOSIS — M542 Cervicalgia: Secondary | ICD-10-CM

## 2021-05-01 ENCOUNTER — Other Ambulatory Visit: Payer: Self-pay

## 2021-05-01 ENCOUNTER — Ambulatory Visit
Admission: RE | Admit: 2021-05-01 | Discharge: 2021-05-01 | Disposition: A | Discharge status: Home / Self Care | Payer: BC Managed Care – PPO | Source: Ambulatory Visit | Attending: Physical Medicine & Rehabilitation | Admitting: Physical Medicine & Rehabilitation

## 2021-05-01 DIAGNOSIS — M542 Cervicalgia: Secondary | ICD-10-CM | POA: Diagnosis not present

## 2021-05-01 IMAGING — MR MR CERVICAL SPINE W/O CM
5 series · 37 of 48 positions shown · non-contrast
Comparison: MRI of the cervical spine [DATE].

CLINICAL DATA: Cervicalgia [4M] ([4M]-CM)

EXAM:
MRI CERVICAL SPINE WITHOUT CONTRAST
TECHNIQUE: Multiplanar, multisequence MR imaging of the cervical spine was
performed. No intravenous contrast was administered.

[Series 5: T2 · sagittal · 3.0mm · 0.62mm/px · 6 of 15 slices shown (1 of 2)]
[im 1/15]
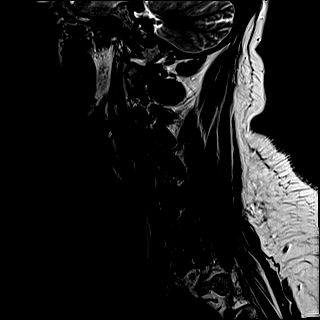
[im 3/15]
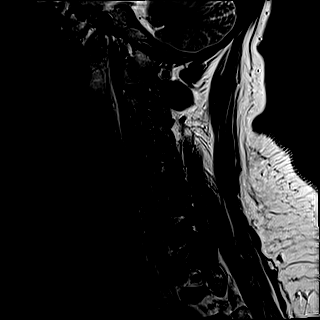
[im 6/15]
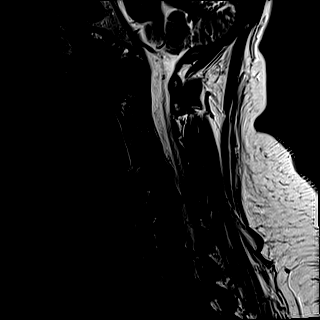
[im 9/15]
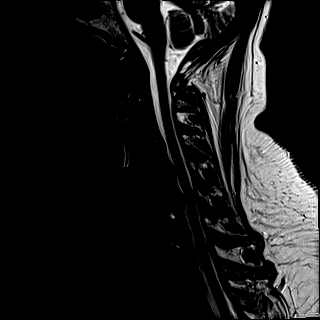
[im 12/15]
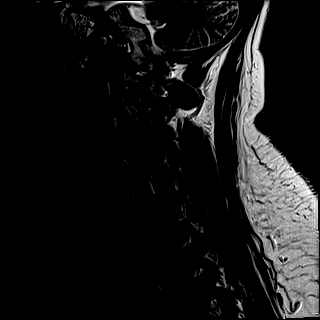
[im 15/15]
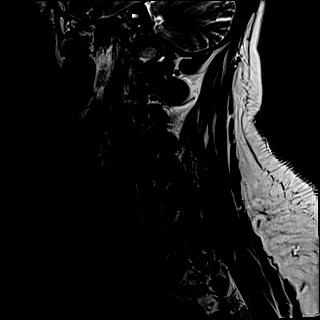

[Series 6: FLAIR · sagittal · 3.0mm · 0.78mm/px · 6 of 15 slices shown]
[im 1/15]
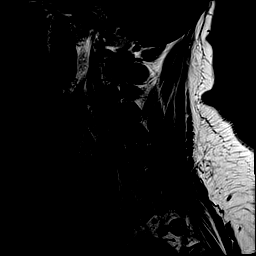
[im 3/15]
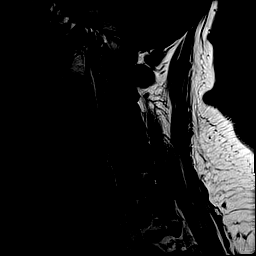
[im 6/15]
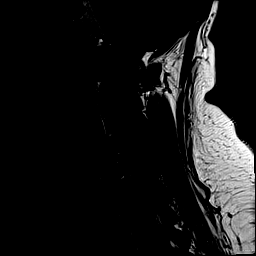
[im 9/15]
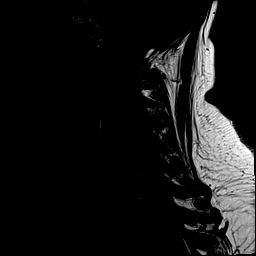
[im 12/15]
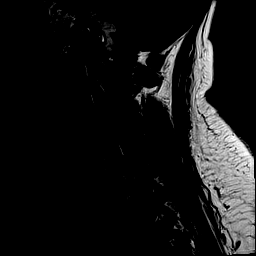
[im 15/15]
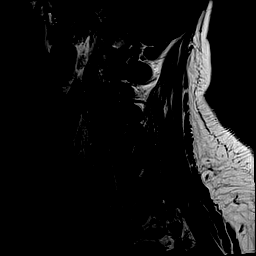

[Series 7: STIR · sagittal · 3.0mm · 0.62mm/px · 6 of 15 slices shown]
[im 1/15]
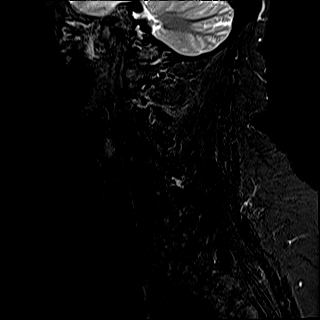
[im 3/15]
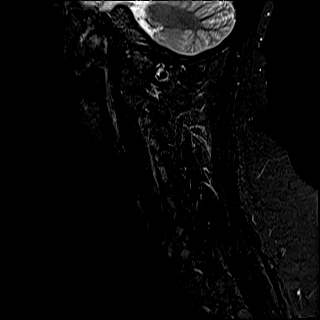
[im 6/15]
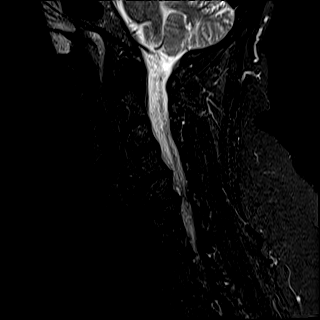
[im 9/15]
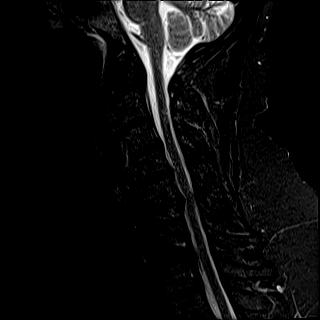
[im 12/15]
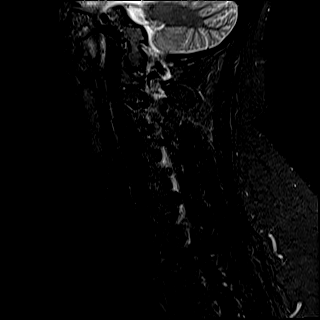
[im 15/15]
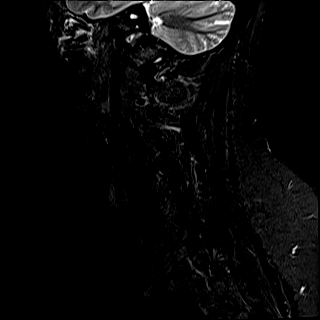

[Series 9: ax mpgr · axial · 3.0mm · 0.35mm/px · z∈[-92,+38]mm · 8 of 40 slices shown]
[im 1/40]
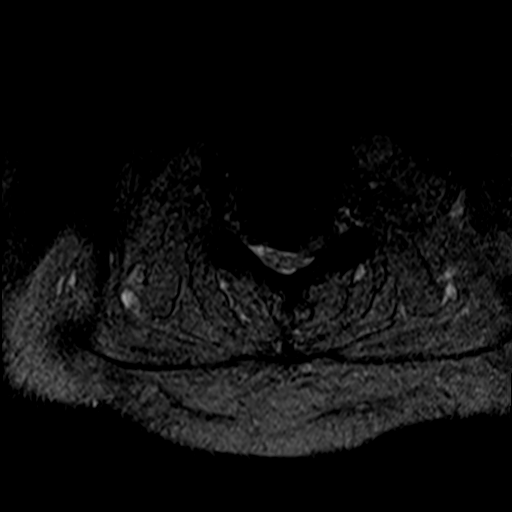
[im 6/40]
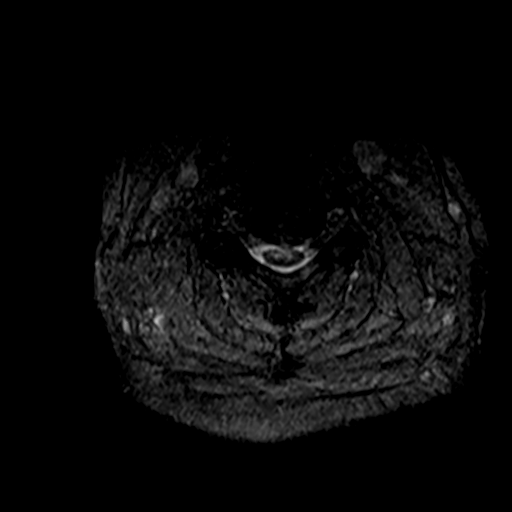
[im 12/40]
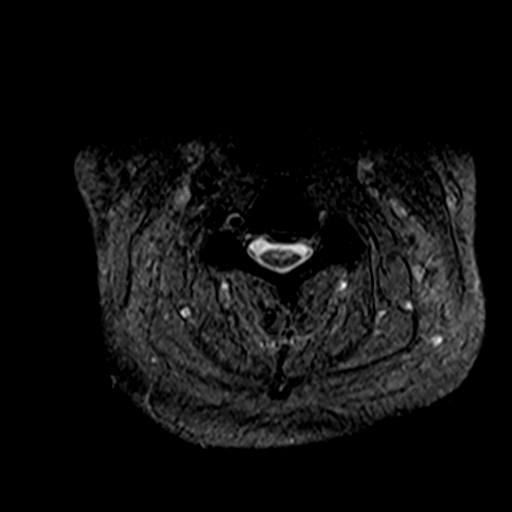
[im 17/40]
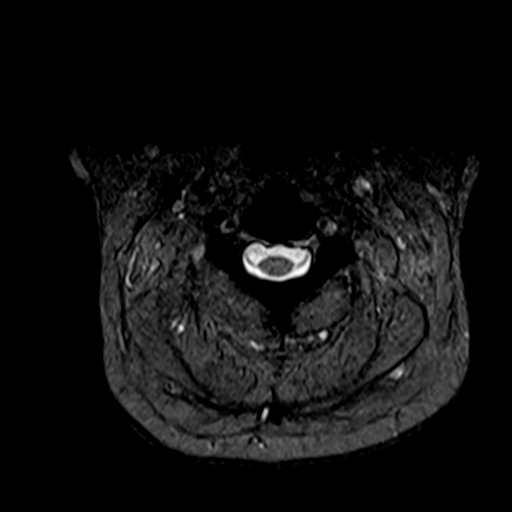
[im 23/40]
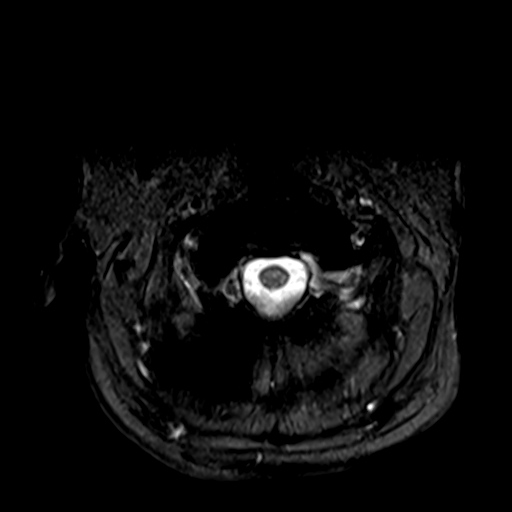
[im 28/40]
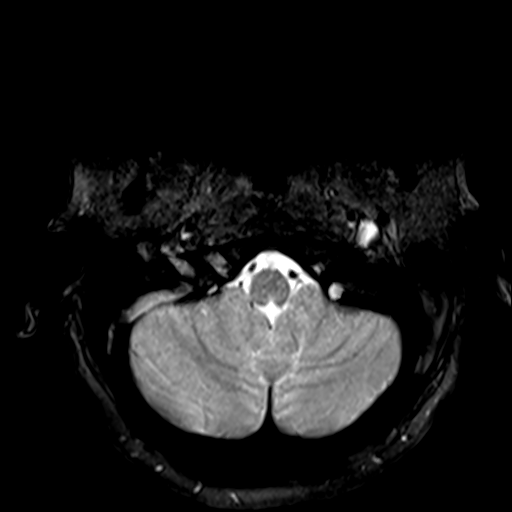
[im 34/40]
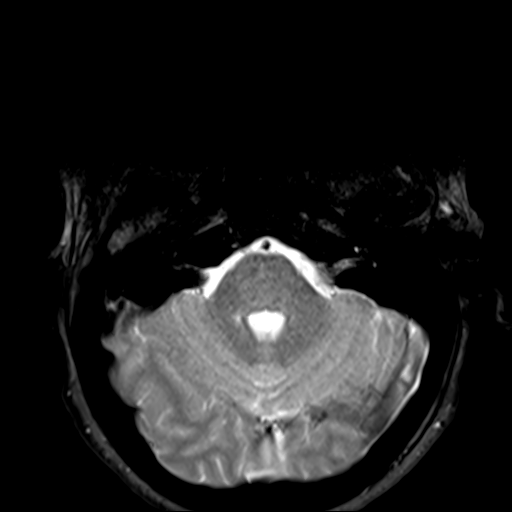
[im 40/40]
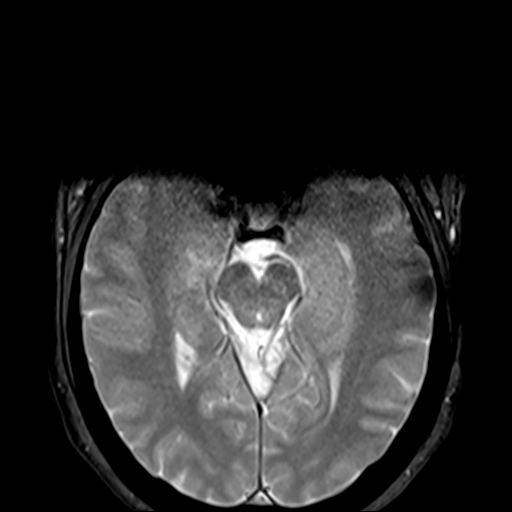

[Series 10: T2 · axial · 3.0mm · 0.70mm/px · z∈[-136,-6]mm · 11 of 40 slices shown (2 of 2)]
[im 1/40]
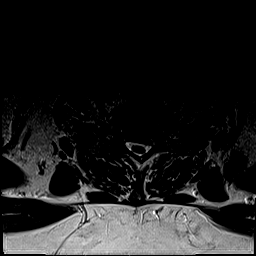
[im 3/40]
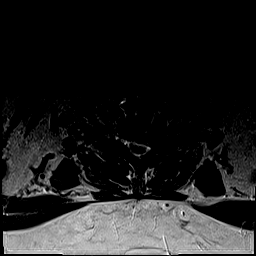
[im 6/40]
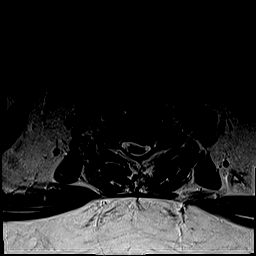
[im 9/40]
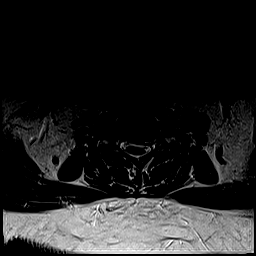
[im 12/40]
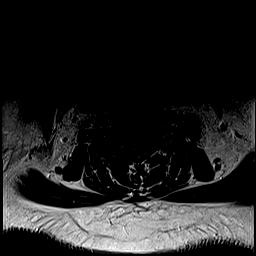
[im 17/40]
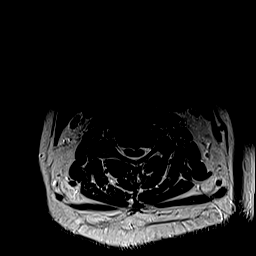
[im 20/40]
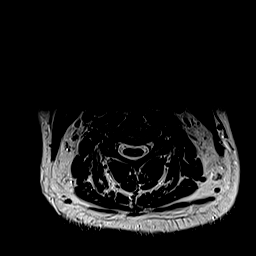
[im 23/40]
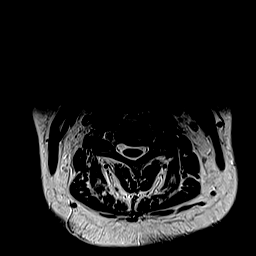
[im 28/40]
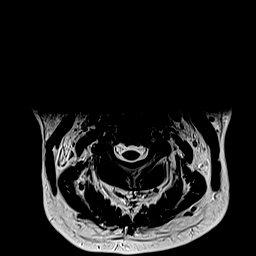
[im 34/40]
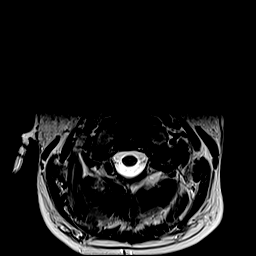
[im 40/40]
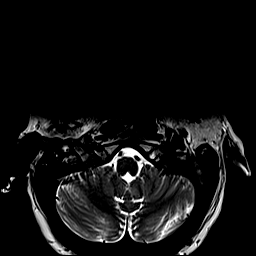

[37 of 48 positions shown; findings below may reference images not displayed]

FINDINGS: Alignment: Similar alignment. Similar straightening with trace
retrolisthesis of C7 on T1. Similar trace anterolisthesis of C4 on
C5.

Vertebrae: C6-C7 ACDF with solid bony fusion. No focal marrow edema
chest acute fracture, discitis/osteomyelitis, or suspicious bone
lesion.

Cord: Normal cord signal.

Posterior Fossa, vertebral arteries, paraspinal tissues: Visualized
vertebral artery flow voids are maintained. Unremarkable paraspinal
soft tissues without evidence of edema. No evidence of acute
abnormality in the visualized posterior fossa on limited assessment.

Disc levels:

C2-C3: Mild uncovertebral hypertrophy with bilateral facet
arthropathy. No significant stenosis.

C3-C4: Posterior disc osteophyte complex, eccentric to the left with
left greater than right facet and uncovertebral hypertrophy.
Resulting similar versus mildly progressed moderate left foraminal
stenosis with potential left C4 nerve impingement. Similar mild
right foraminal stenosis and mild canal stenosis.

C4-C5: Posterior disc osteophyte complex with small central disc
protrusion which contacts and flattens the ventral cord. Central
disc protrusion is mildly progressed with overall similar mild canal
stenosis. Similar mild left foraminal stenosis with patent right
foramina.

C5-C6: Left eccentric posterior disc osteophyte complex with left
greater than right facet and uncovertebral hypertrophy. Similar
resulting moderate to severe spinal canal stenosis with flattening
of the cord. Similar severe left greater than right foraminal
stenosis.

C6-C7: Prior ACDF with patent canal. At least mild bilateral
foraminal stenosis due to spurring, similar.

C7-T1: Left eccentric posterior disc osteophyte complex with left
greater than right facet and uncovertebral hypertrophy. Similar
severe left and mild-to-moderate right foraminal stenosis with
potential impingement of the exiting left C8 nerve. Similar mild
canal stenosis.
IMPRESSION: 1. At C5-C6, similar moderate to severe canal stenosis and severe
bilateral neural foraminal stenosis.
2. At C7-T1, similar severe left foraminal stenosis.
3. At C3-C4, similar versus mildly progressed moderate left
foraminal stenosis.
4. Additional milder multilevel degenerative change is detailed
above.

## 2021-05-03 ENCOUNTER — Other Ambulatory Visit: Payer: Self-pay | Admitting: Neurosurgery

## 2021-05-03 DIAGNOSIS — Z01818 Encounter for other preprocedural examination: Secondary | ICD-10-CM

## 2021-05-07 ENCOUNTER — Other Ambulatory Visit: Payer: Self-pay

## 2021-05-07 ENCOUNTER — Encounter
Admission: RE | Admit: 2021-05-07 | Discharge: 2021-05-07 | Disposition: A | Discharge status: Home / Self Care | Payer: BC Managed Care – PPO | Source: Ambulatory Visit | Attending: Neurosurgery | Admitting: Neurosurgery

## 2021-05-07 VITALS — BP 95/60 | HR 79 | Temp 97.6°F | Resp 18 | Ht 72.0 in | Wt 235.0 lb

## 2021-05-07 DIAGNOSIS — E119 Type 2 diabetes mellitus without complications: Secondary | ICD-10-CM | POA: Diagnosis not present

## 2021-05-07 DIAGNOSIS — Z01818 Encounter for other preprocedural examination: Secondary | ICD-10-CM | POA: Diagnosis present

## 2021-05-07 LAB — BASIC METABOLIC PANEL
Anion gap: 8 (ref 5–15)
BUN: 32 mg/dL — ABNORMAL HIGH (ref 6–20)
CO2: 29 mmol/L (ref 22–32)
Calcium: 9.4 mg/dL (ref 8.9–10.3)
Chloride: 102 mmol/L (ref 98–111)
Creatinine, Ser: 1.27 mg/dL — ABNORMAL HIGH (ref 0.44–1.00)
GFR, Estimated: 51 mL/min — ABNORMAL LOW (ref >60)
Glucose, Bld: 125 mg/dL — ABNORMAL HIGH (ref 70–99)
Potassium: 3.9 mmol/L (ref 3.5–5.1)
Sodium: 139 mmol/L (ref 135–145)

## 2021-05-07 LAB — PROTIME-INR
INR: 1 (ref 0.8–1.2)
Prothrombin Time: 13.2 seconds (ref 11.4–15.2)

## 2021-05-07 LAB — URINALYSIS, ROUTINE W REFLEX MICROSCOPIC
Bacteria, UA: NONE SEEN
Bilirubin Urine: NEGATIVE
Glucose, UA: >=500 mg/dL — AB
Hgb urine dipstick: NEGATIVE
Ketones, ur: NEGATIVE mg/dL
Leukocytes,Ua: NEGATIVE
Nitrite: NEGATIVE
Protein, ur: NEGATIVE mg/dL
Specific Gravity, Urine: 1.02 (ref 1.005–1.030)
pH: 5 (ref 5.0–8.0)

## 2021-05-07 LAB — HEMOGLOBIN A1C
Hgb A1c MFr Bld: 5.6 % (ref 4.8–5.6)
Mean Plasma Glucose: 114 mg/dL

## 2021-05-07 LAB — CBC
HCT: 38.4 % (ref 36.0–46.0)
Hemoglobin: 12.3 g/dL (ref 12.0–15.0)
MCH: 30.2 pg (ref 26.0–34.0)
MCHC: 32 g/dL (ref 30.0–36.0)
MCV: 94.3 fL (ref 80.0–100.0)
Platelets: 222 10*3/uL (ref 150–400)
RBC: 4.07 MIL/uL (ref 3.87–5.11)
RDW: 13.9 % (ref 11.5–15.5)
WBC: 9.2 10*3/uL (ref 4.0–10.5)
nRBC: 0 % (ref 0.0–0.2)

## 2021-05-07 LAB — TYPE AND SCREEN
ABO/RH(D): O POS
Antibody Screen: NEGATIVE

## 2021-05-07 LAB — SURGICAL PCR SCREEN
MRSA, PCR: NEGATIVE
Staphylococcus aureus: NEGATIVE

## 2021-05-07 LAB — APTT: aPTT: 31 seconds (ref 24–36)

## 2021-05-07 NOTE — Patient Instructions (Addendum)
Your procedure is scheduled on: Monday May 14, 2021. Report to Day [facility] inside [facility] 2nd floor, stop by admissions desk before getting on elevator. To find out your arrival time please call [phone] between 1PM - 3PM on Friday May 11, 2021.  Remember: Instructions that are not followed completely may result in serious medical risk,  up to and including death, or upon the discretion of your surgeon and anesthesiologist your  surgery may need to be rescheduled.     _X__ 1. Do not eat food after midnight the night before your procedure.                 No chewing gum or hard candies. You may drink clear liquids up to 2 hours                 before you are scheduled to arrive for your surgery- DO not drink clear                 liquids within 2 hours of the start of your surgery.                 Clear Liquids include:  water, apple juice without pulp, clear Gatorade, G2 or                  Gatorade Zero (avoid Red/Purple/Blue), Black Coffee or Tea (Do not add                 anything to coffee or tea).  __X__2.  On the morning of surgery brush your teeth with toothpaste and water, you                may rinse your mouth with mouthwash if you wish.  Do not swallow any toothpaste or mouthwash.     _X__ 3.  No Alcohol for 24 hours before or after surgery.   _X__ 4.  Do Not Smoke or use e-cigarettes For 24 Hours Prior to Your Surgery.                 Do not use any chewable tobacco products for at least 6 hours prior to                 Surgery.  _X__  5.  Do not use any recreational drugs (marijuana, cocaine, heroin, ecstasy, MDMA or other)                For at least one week prior to your surgery.  Combination of these drugs with anesthesia                May have life threatening results.  ____  6.  Bring all medications with you on the day of surgery if instructed.   __X__  7.  Notify your doctor if there is any change in your medical  condition      (cold, fever, infections).     Do not wear jewelry, make-up, hairpins, clips or nail polish. Do not wear lotions, powders, or perfumes. You may wear deodorant. Do not shave 48 hours prior to surgery. Men may shave face and neck. Do not bring valuables to the hospital.    [facility] is not responsible for any belongings or valuables.  Contacts, dentures or bridgework may not be worn into surgery. Leave your suitcase in the car. After surgery it may be brought to your room. For patients admitted to the hospital, discharge time is determined  by your treatment team.   Patients discharged the day of surgery will not be allowed to drive home.   Make arrangements for someone to be with you for the first 24 hours of your Same Day Discharge.    Please read over the following fact sheets that you were given:   Spine Surgery Patient Guide    __X__ Take these medicines the morning of surgery with A SIP OF WATER:    1. atorvastatin (LIPITOR) 20 MG    2.   3.   4.  5.  6.  ____ Fleet Enema (as directed)   __X__ Use CHG Soap (or wipes) as directed  ____ Use Benzoyl Peroxide Gel as instructed  ____ Use inhalers on the day of surgery  __X__ Stop metFORMIN (GLUCOPHAGE-XR) 500 MG 2 days prior to surgery    __X__ Stop FARXIGA 10 MG 3 days prior to surgery   ____ Take 1/2 of usual insulin dose the night before surgery. No insulin the morning          of surgery.   ____ Call your PCP, cardiologist, or Pulmonologist if taking Coumadin/Plavix/aspirin and ask when to stop before your surgery.   __X__ One Week prior to surgery- Stop Anti-inflammatories such as Ibuprofen, Aleve, Advil, Motrin, meloxicam (MOBIC), diclofenac, etodolac, ketorolac, Toradol, Daypro, piroxicam, Goody's or BC powders. OK TO USE TYLENOL IF NEEDED   __X__ Stop supplements until after surgery.    ____ Bring C-Pap to the hospital.    If you have any questions regarding your pre-procedure  instructions,  Please call Pre-admit Testing at [phone]

## 2021-05-14 ENCOUNTER — Other Ambulatory Visit: Payer: Self-pay

## 2021-05-14 ENCOUNTER — Ambulatory Visit: Payer: BC Managed Care – PPO | Admitting: Anesthesiology

## 2021-05-14 ENCOUNTER — Ambulatory Visit: Payer: BC Managed Care – PPO

## 2021-05-14 ENCOUNTER — Encounter: Payer: Self-pay | Admitting: Neurosurgery

## 2021-05-14 ENCOUNTER — Encounter
Admission: RE | Disposition: A | Discharge status: Home / Self Care | Payer: Self-pay | Source: Home / Self Care | Attending: Neurosurgery

## 2021-05-14 ENCOUNTER — Ambulatory Visit
Admission: RE | Admit: 2021-05-14 | Discharge: 2021-05-14 | Disposition: A | Discharge status: Home / Self Care | Payer: BC Managed Care – PPO | Attending: Neurosurgery | Admitting: Neurosurgery

## 2021-05-14 DIAGNOSIS — G959 Disease of spinal cord, unspecified: Secondary | ICD-10-CM | POA: Insufficient documentation

## 2021-05-14 DIAGNOSIS — M4802 Spinal stenosis, cervical region: Secondary | ICD-10-CM | POA: Insufficient documentation

## 2021-05-14 DIAGNOSIS — Z419 Encounter for procedure for purposes other than remedying health state, unspecified: Secondary | ICD-10-CM

## 2021-05-14 DIAGNOSIS — Z87891 Personal history of nicotine dependence: Secondary | ICD-10-CM | POA: Diagnosis not present

## 2021-05-14 HISTORY — PX: ANTERIOR CERVICAL DECOMP/DISCECTOMY FUSION: SHX1161

## 2021-05-14 LAB — GLUCOSE, CAPILLARY
Glucose-Capillary: 124 mg/dL — ABNORMAL HIGH (ref 70–99)
Glucose-Capillary: 124 mg/dL — ABNORMAL HIGH (ref 70–99)

## 2021-05-14 LAB — ABO/RH: ABO/RH(D): O POS

## 2021-05-14 IMAGING — RF DG CERVICAL SPINE 2 OR 3 VIEWS
1 series · 5 of 5 positions shown · non-contrast
Comparison: None.

CLINICAL DATA: Fluoroscopic assistance for cervical spine fusion

EXAM:
CERVICAL SPINE - 2-3 VIEW

[Series 1: dg x-ray · 0.20mm/px · 5 of 5 slices shown]
[im 1/5]
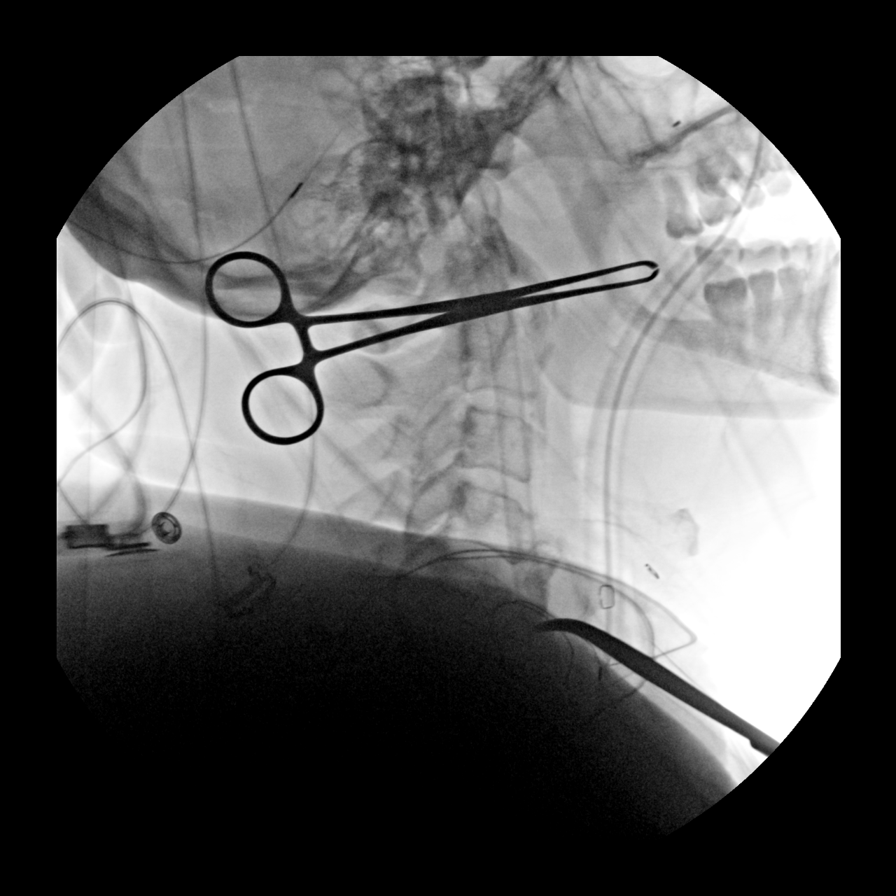
[im 2/5]
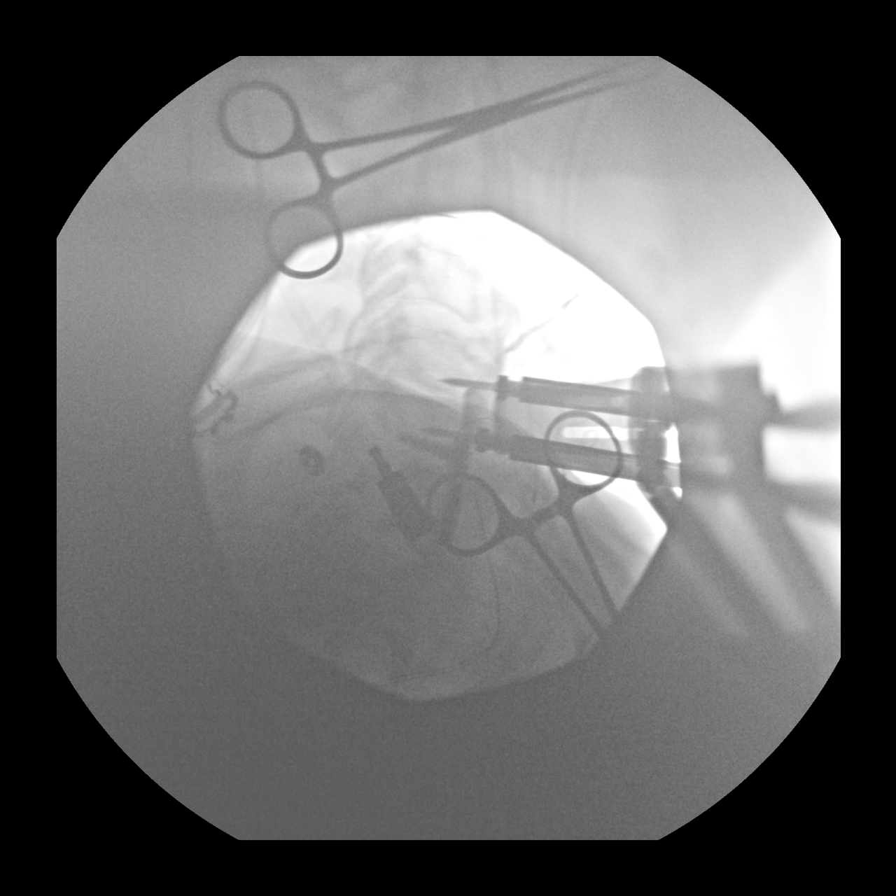
[im 3/5]
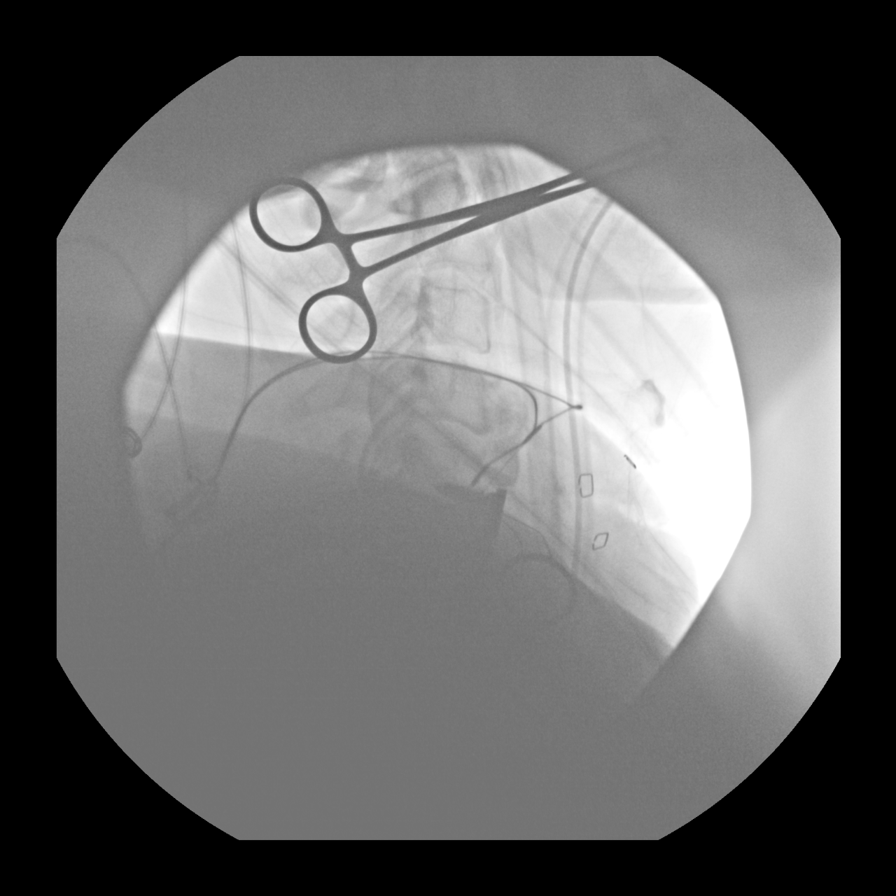
[im 4/5]
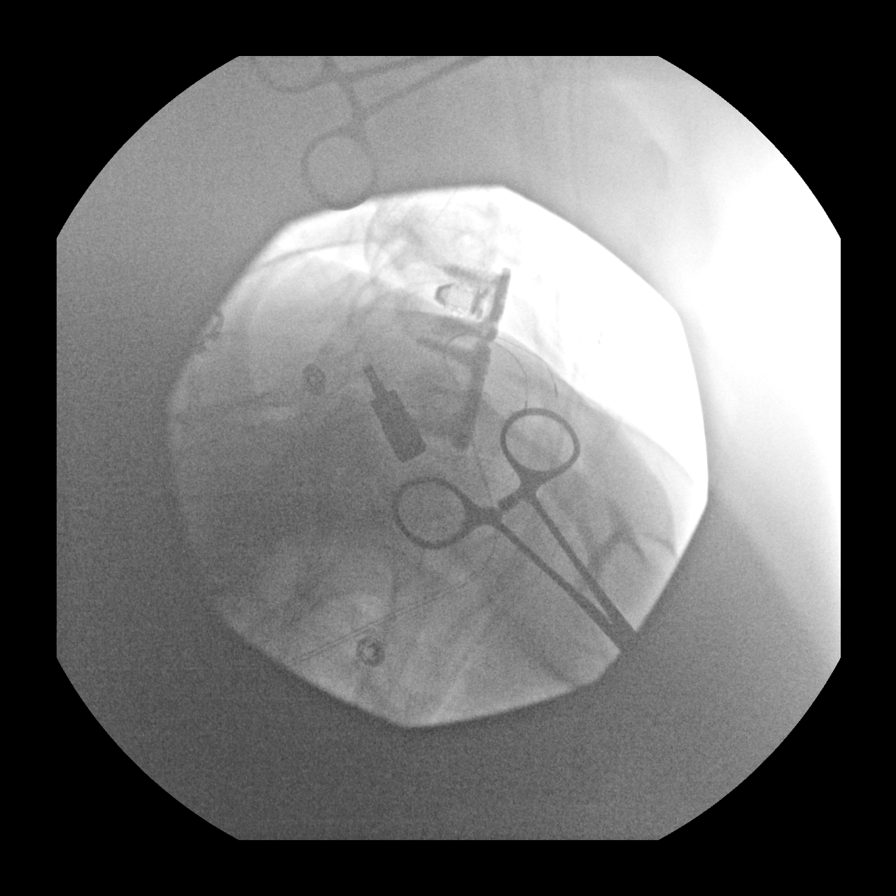
[im 5/5]
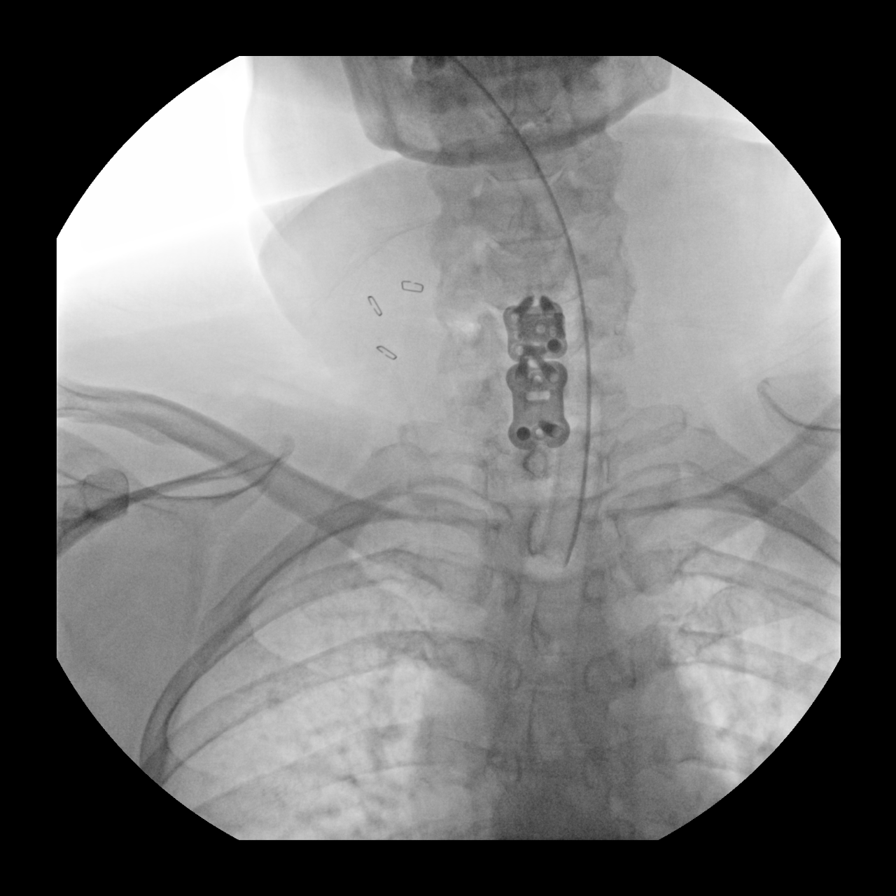

[5 of 5 positions shown; findings below may reference images not displayed]

FINDINGS: Fluoroscopic images show interval anterior fusion at C5-C6 level.
There is previous anterior fusion at C6-C7 level. Fluoroscopic time
was 9 seconds. Estimated radiation dose is 3.27 mGy.
IMPRESSION: Fluoroscopic assistance was provided for cervical spinal fusion at
C5-C6 level.

## 2021-05-14 SURGERY — ANTERIOR CERVICAL DECOMPRESSION/DISCECTOMY FUSION 1 LEVEL
Anesthesia: General | Site: Spine Cervical

## 2021-05-14 MED ORDER — SENNA 8.6 MG PO TABS: 1.0000 | ORAL_TABLET | Freq: Every day | ORAL | 0 refills | Status: DC | PRN

## 2021-05-14 MED ORDER — ONDANSETRON HCL 4 MG/2ML IJ SOLN
INTRAMUSCULAR | Status: DC | PRN
  Administered 2021-05-14: 4 mg via INTRAVENOUS

## 2021-05-14 MED ORDER — LIDOCAINE HCL (CARDIAC) PF 100 MG/5ML IV SOSY
PREFILLED_SYRINGE | INTRAVENOUS | Status: DC | PRN
  Administered 2021-05-14: 100 mg via INTRAVENOUS

## 2021-05-14 MED ORDER — PROPOFOL 10 MG/ML IV BOLUS
INTRAVENOUS | Status: AC
  Filled 2021-05-14: qty 20

## 2021-05-14 MED ORDER — CEFAZOLIN SODIUM-DEXTROSE 2-4 GM/100ML-% IV SOLN
INTRAVENOUS | Status: AC
Start: 2021-05-14 — End: 2021-05-14
  Filled 2021-05-14: qty 100

## 2021-05-14 MED ORDER — PHENYLEPHRINE 40 MCG/ML (10ML) SYRINGE FOR IV PUSH (FOR BLOOD PRESSURE SUPPORT)
PREFILLED_SYRINGE | INTRAVENOUS | Status: AC
  Filled 2021-05-14: qty 10

## 2021-05-14 MED ORDER — FENTANYL CITRATE (PF) 100 MCG/2ML IJ SOLN
INTRAMUSCULAR | Status: DC | PRN
  Administered 2021-05-14 (×2): 50 ug via INTRAVENOUS

## 2021-05-14 MED ORDER — ONDANSETRON HCL 4 MG/2ML IJ SOLN: 4.0000 mg | Freq: Once | INTRAMUSCULAR | Status: DC | PRN

## 2021-05-14 MED ORDER — ORAL CARE MOUTH RINSE: 15.0000 mL | Freq: Once | OROMUCOSAL | Status: AC

## 2021-05-14 MED ORDER — BUPIVACAINE-EPINEPHRINE (PF) 0.5% -1:200000 IJ SOLN
INTRAMUSCULAR | Status: AC
  Filled 2021-05-14: qty 30

## 2021-05-14 MED ORDER — LACTATED RINGERS IV SOLN: INTRAVENOUS | Status: DC

## 2021-05-14 MED ORDER — OXYCODONE HCL 5 MG/5ML PO SOLN: 5.0000 mg | Freq: Once | ORAL | Status: AC | PRN

## 2021-05-14 MED ORDER — CHLORHEXIDINE GLUCONATE 0.12 % MT SOLN
OROMUCOSAL | Status: AC
Start: 2021-05-14 — End: 2021-05-14
  Administered 2021-05-14: 15 mL via OROMUCOSAL
  Filled 2021-05-14: qty 15

## 2021-05-14 MED ORDER — PROPOFOL 10 MG/ML IV BOLUS
INTRAVENOUS | Status: DC | PRN
  Administered 2021-05-14: 150 mg via INTRAVENOUS
  Administered 2021-05-14: 120 ug/kg/min via INTRAVENOUS
  Administered 2021-05-14: 30 mg via INTRAVENOUS

## 2021-05-14 MED ORDER — METHOCARBAMOL 500 MG PO TABS
500.0000 mg | ORAL_TABLET | ORAL | Status: AC
  Administered 2021-05-14: 500 mg via ORAL

## 2021-05-14 MED ORDER — ACETAMINOPHEN 10 MG/ML IV SOLN
1000.0000 mg | Freq: Once | INTRAVENOUS | Status: DC | PRN
  Administered 2021-05-14: 1000 mg via INTRAVENOUS

## 2021-05-14 MED ORDER — OXYCODONE HCL 5 MG PO TABS
5.0000 mg | ORAL_TABLET | Freq: Once | ORAL | Status: AC | PRN
  Administered 2021-05-14: 5 mg via ORAL

## 2021-05-14 MED ORDER — PHENYLEPHRINE HCL-NACL 20-0.9 MG/250ML-% IV SOLN
INTRAVENOUS | Status: DC | PRN
  Administered 2021-05-14: 20 ug/min via INTRAVENOUS

## 2021-05-14 MED ORDER — CEFAZOLIN SODIUM-DEXTROSE 2-4 GM/100ML-% IV SOLN
2.0000 g | Freq: Once | INTRAVENOUS | Status: AC
  Administered 2021-05-14: 2 g via INTRAVENOUS

## 2021-05-14 MED ORDER — MIDAZOLAM HCL 2 MG/2ML IJ SOLN
INTRAMUSCULAR | Status: AC
  Filled 2021-05-14: qty 2

## 2021-05-14 MED ORDER — SODIUM CHLORIDE (PF) 0.9 % IJ SOLN
INTRAMUSCULAR | Status: AC
  Filled 2021-05-14: qty 20

## 2021-05-14 MED ORDER — METHOCARBAMOL 500 MG PO TABS
ORAL_TABLET | ORAL | Status: AC
  Filled 2021-05-14: qty 1

## 2021-05-14 MED ORDER — OXYCODONE HCL 5 MG PO TABS
ORAL_TABLET | ORAL | Status: AC
  Filled 2021-05-14: qty 1

## 2021-05-14 MED ORDER — FAMOTIDINE 20 MG PO TABS
ORAL_TABLET | ORAL | Status: AC
  Administered 2021-05-14: 20 mg via ORAL
  Filled 2021-05-14: qty 1

## 2021-05-14 MED ORDER — REMIFENTANIL HCL 1 MG IV SOLR
INTRAVENOUS | Status: DC | PRN
Start: 2021-05-14 — End: 2021-05-14
  Administered 2021-05-14: .1 ug/kg/min via INTRAVENOUS

## 2021-05-14 MED ORDER — SUCCINYLCHOLINE CHLORIDE 200 MG/10ML IV SOSY
PREFILLED_SYRINGE | INTRAVENOUS | Status: AC
  Filled 2021-05-14: qty 10

## 2021-05-14 MED ORDER — FENTANYL CITRATE (PF) 100 MCG/2ML IJ SOLN
25.0000 ug | INTRAMUSCULAR | Status: DC | PRN
  Administered 2021-05-14 (×2): 25 ug via INTRAVENOUS

## 2021-05-14 MED ORDER — PROPOFOL 1000 MG/100ML IV EMUL
INTRAVENOUS | Status: AC
  Filled 2021-05-14: qty 100

## 2021-05-14 MED ORDER — PHENYLEPHRINE HCL (PRESSORS) 10 MG/ML IV SOLN
INTRAVENOUS | Status: AC
  Filled 2021-05-14: qty 1

## 2021-05-14 MED ORDER — FENTANYL CITRATE (PF) 100 MCG/2ML IJ SOLN
INTRAMUSCULAR | Status: AC
  Administered 2021-05-14: 50 ug via INTRAVENOUS
  Filled 2021-05-14: qty 2

## 2021-05-14 MED ORDER — BUPIVACAINE-EPINEPHRINE (PF) 0.5% -1:200000 IJ SOLN
INTRAMUSCULAR | Status: DC | PRN
  Administered 2021-05-14: 4 mL

## 2021-05-14 MED ORDER — SUCCINYLCHOLINE CHLORIDE 200 MG/10ML IV SOSY
PREFILLED_SYRINGE | INTRAVENOUS | Status: DC | PRN
  Administered 2021-05-14: 100 mg via INTRAVENOUS

## 2021-05-14 MED ORDER — REMIFENTANIL HCL 1 MG IV SOLR
INTRAVENOUS | Status: AC
  Filled 2021-05-14: qty 1000

## 2021-05-14 MED ORDER — MIDAZOLAM HCL 2 MG/2ML IJ SOLN
INTRAMUSCULAR | Status: DC | PRN
  Administered 2021-05-14: 2 mg via INTRAVENOUS

## 2021-05-14 MED ORDER — SURGIFLO WITH THROMBIN (HEMOSTATIC MATRIX KIT) OPTIME
TOPICAL | Status: DC | PRN
  Administered 2021-05-14: 1 via TOPICAL

## 2021-05-14 MED ORDER — DEXAMETHASONE SODIUM PHOSPHATE 10 MG/ML IJ SOLN
INTRAMUSCULAR | Status: AC
  Filled 2021-05-14: qty 1

## 2021-05-14 MED ORDER — SODIUM CHLORIDE 0.9 % IV SOLN: INTRAVENOUS | Status: DC

## 2021-05-14 MED ORDER — ONDANSETRON HCL 4 MG/2ML IJ SOLN
INTRAMUSCULAR | Status: AC
  Filled 2021-05-14: qty 2

## 2021-05-14 MED ORDER — 0.9 % SODIUM CHLORIDE (POUR BTL) OPTIME
TOPICAL | Status: DC | PRN
  Administered 2021-05-14: 250 mL

## 2021-05-14 MED ORDER — PHENYLEPHRINE HCL (PRESSORS) 10 MG/ML IV SOLN
INTRAVENOUS | Status: DC | PRN
Start: 2021-05-14 — End: 2021-05-14
  Administered 2021-05-14: 80 ug via INTRAVENOUS

## 2021-05-14 MED ORDER — FENTANYL CITRATE (PF) 100 MCG/2ML IJ SOLN
INTRAMUSCULAR | Status: AC
  Filled 2021-05-14: qty 2

## 2021-05-14 MED ORDER — CHLORHEXIDINE GLUCONATE 0.12 % MT SOLN: 15.0000 mL | Freq: Once | OROMUCOSAL | Status: AC

## 2021-05-14 MED ORDER — OXYCODONE HCL 5 MG PO TABS: 5.0000 mg | ORAL_TABLET | ORAL | 0 refills | Status: AC | PRN

## 2021-05-14 MED ORDER — METHOCARBAMOL 500 MG PO TABS: 500.0000 mg | ORAL_TABLET | Freq: Three times a day (TID) | ORAL | 0 refills | Status: DC | PRN

## 2021-05-14 MED ORDER — ACETAMINOPHEN 10 MG/ML IV SOLN
INTRAVENOUS | Status: AC
  Filled 2021-05-14: qty 100

## 2021-05-14 MED ORDER — FAMOTIDINE 20 MG PO TABS: 20.0000 mg | ORAL_TABLET | Freq: Once | ORAL | Status: AC

## 2021-05-14 MED ORDER — LIDOCAINE HCL (PF) 2 % IJ SOLN
INTRAMUSCULAR | Status: AC
  Filled 2021-05-14: qty 5

## 2021-05-14 MED ORDER — DEXAMETHASONE SODIUM PHOSPHATE 10 MG/ML IJ SOLN
INTRAMUSCULAR | Status: DC | PRN
  Administered 2021-05-14: 10 mg via INTRAVENOUS

## 2021-05-14 SURGICAL SUPPLY — 65 items
ADH SKN CLS APL DERMABOND .7 (GAUZE/BANDAGES/DRESSINGS) ×1
AGENT HMST KT MTR STRL THRMB (HEMOSTASIS) ×1
APL PRP STRL LF DISP 70% ISPRP (MISCELLANEOUS) ×2
BASKET BONE COLLECTION (BASKET) IMPLANT
BULB RESERV EVAC DRAIN JP 100C (MISCELLANEOUS) IMPLANT
BUR NEURO DRILL SOFT 3.0X3.8M (BURR) ×2 IMPLANT
CHLORAPREP W/TINT 26 (MISCELLANEOUS) ×4 IMPLANT
COUNTER NEEDLE 20/40 LG (NEEDLE) ×2 IMPLANT
CUP MEDICINE 2OZ PLAST GRAD ST (MISCELLANEOUS) ×2 IMPLANT
DERMABOND ADVANCED (GAUZE/BANDAGES/DRESSINGS) ×1
DERMABOND ADVANCED .7 DNX12 (GAUZE/BANDAGES/DRESSINGS) ×1 IMPLANT
DRAIN CHANNEL JP 10F RND 20C F (MISCELLANEOUS) IMPLANT
DRAPE C ARM PK CFD 31 SPINE (DRAPES) ×2 IMPLANT
DRAPE LAPAROTOMY 77X122 PED (DRAPES) ×2 IMPLANT
DRAPE MICROSCOPE SPINE 48X150 (DRAPES) ×2 IMPLANT
DRAPE SURG 17X11 SM STRL (DRAPES) ×8 IMPLANT
ELECT CAUTERY BLADE TIP 2.5 (TIP) ×2
ELECT REM PT RETURN 9FT ADLT (ELECTROSURGICAL) ×2
ELECTRODE CAUTERY BLDE TIP 2.5 (TIP) ×1 IMPLANT
ELECTRODE REM PT RTRN 9FT ADLT (ELECTROSURGICAL) ×1 IMPLANT
FEE INTRAOP CADWELL SUPPLY NCS (MISCELLANEOUS) ×1 IMPLANT
FEE INTRAOP MONITOR IMPULS NCS (MISCELLANEOUS) IMPLANT
GAUZE 4X4 16PLY ~~LOC~~+RFID DBL (SPONGE) ×2 IMPLANT
GLOVE SURG SYN 6.5 ES PF (GLOVE) ×2 IMPLANT
GLOVE SURG SYN 6.5 PF PI (GLOVE) ×1 IMPLANT
GLOVE SURG SYN 8.5  E (GLOVE) ×3
GLOVE SURG SYN 8.5 E (GLOVE) ×3 IMPLANT
GLOVE SURG SYN 8.5 PF PI (GLOVE) ×3 IMPLANT
GLOVE SURG UNDER POLY LF SZ6.5 (GLOVE) ×2 IMPLANT
GOWN SRG LRG LVL 4 IMPRV REINF (GOWNS) ×1 IMPLANT
GOWN SRG XL LVL 3 NONREINFORCE (GOWNS) ×1 IMPLANT
GOWN STRL NON-REIN TWL XL LVL3 (GOWNS) ×2
GOWN STRL REIN LRG LVL4 (GOWNS) ×2
GRADUATE 1200CC STRL 31836 (MISCELLANEOUS) ×2 IMPLANT
INTRAOP CADWELL SUPPLY FEE NCS (MISCELLANEOUS) ×1
INTRAOP DISP SUPPLY FEE NCS (MISCELLANEOUS) ×2
INTRAOP MONITOR FEE IMPULS NCS (MISCELLANEOUS) ×1
INTRAOP MONITOR FEE IMPULSE (MISCELLANEOUS) ×1
KIT TURNOVER KIT A (KITS) ×2 IMPLANT
MANIFOLD NEPTUNE II (INSTRUMENTS) ×2 IMPLANT
MARKER SKIN DUAL TIP RULER LAB (MISCELLANEOUS) ×4 IMPLANT
NDL SAFETY ECLIPSE 18X1.5 (NEEDLE) ×1 IMPLANT
NEEDLE HYPO 18GX1.5 SHARP (NEEDLE) ×2
NEEDLE HYPO 22GX1.5 SAFETY (NEEDLE) ×2 IMPLANT
NS IRRIG 1000ML POUR BTL (IV SOLUTION) ×2 IMPLANT
NS IRRIG 500ML POUR BTL (IV SOLUTION) ×1 IMPLANT
PACK LAMINECTOMY NEURO (CUSTOM PROCEDURE TRAY) ×2 IMPLANT
PAD ARMBOARD 7.5X6 YLW CONV (MISCELLANEOUS) ×4 IMPLANT
PIN CASPAR 14 (PIN) ×1 IMPLANT
PIN CASPAR 14MM (PIN) ×2 IMPLANT
PLATE ANT CERV XTEND 1 LV 10 (Plate) ×1 IMPLANT
PUTTY DBX 1CC (Putty) ×2 IMPLANT
PUTTY DBX 1CC DEPUY (Putty) IMPLANT
SCREW VAR 4.2 XD SELF DRILL 16 (Screw) ×4 IMPLANT
SPACER C HEDRON 12X14 7M 7D (Spacer) ×1 IMPLANT
SPONGE KITTNER 5P (MISCELLANEOUS) ×2 IMPLANT
STAPLER SKIN PROX 35W (STAPLE) ×1 IMPLANT
SURGIFLO W/THROMBIN 8M KIT (HEMOSTASIS) ×2 IMPLANT
SUT V-LOC 90 ABS DVC 3-0 CL (SUTURE) ×2 IMPLANT
SUT VIC AB 3-0 SH 8-18 (SUTURE) ×2 IMPLANT
SYR 30ML LL (SYRINGE) ×2 IMPLANT
TAPE CLOTH 3X10 WHT NS LF (GAUZE/BANDAGES/DRESSINGS) ×2 IMPLANT
TOWEL OR 17X26 4PK STRL BLUE (TOWEL DISPOSABLE) ×6 IMPLANT
TRAY FOLEY MTR SLVR 16FR STAT (SET/KITS/TRAYS/PACK) IMPLANT
TUBING CONNECTING 10 (TUBING) ×2 IMPLANT

## 2021-05-14 NOTE — Discharge Summary (Signed)
Physician Discharge Summary  Patient ID: Lori Calhoun MRN: [id number] DOB/AGE: Aug 09, 1970 51 y.o.  Admit date: 05/14/2021 Discharge date: 05/14/2021  Admission Diagnoses: cervical myelopathy   Discharge Diagnoses:  Active Problems:   * No active hospital problems. *   Discharged Condition: good  Hospital Course:  Lori Calhoun is a 51 y.o s/p C5-6 ACDF for cervical myelopathy. Her interoperative course was uncomplicated. She was monitored post-op in PACU for 4 hours post-op and discharged home after ambulating, urinating and tolerating PO intake. She was given prescriptions for Oxycodone, Robaxin, and Senna   Consults:  none   Significant Diagnostic Studies: none  Treatments: surgery: as above. Please see separately dictated operative report for further details   Discharge Exam: Blood pressure 119/60, pulse 78, temperature (!) 97.5 F (36.4 C), temperature source Temporal, resp. rate 18, height 6' (1.829 m), weight 106.6 kg, SpO2 100 %. CN II-XII grossly intact 5/5 strength throughout RUE. 3/5 left IO, HG, bicep and tricep, 5/5 left delt Incision c/d/I with dermabond in place  Disposition: Discharge disposition: 01-Home or Self Care       Discharge Instructions     Increase activity slowly   Complete by: As directed       Allergies as of 05/14/2021       Reactions   Celebrex [celecoxib] Anaphylaxis   Ace Inhibitors Swelling, Other (See Comments)   TONGUE SWELLING   Penicillins Other (See Comments)   Pancreatitis Has patient had a PCN reaction causing immediate rash, facial/tongue/throat swelling, SOB or lightheadedness with hypotension: Unknown Has patient had a PCN reaction causing severe rash involving mucus membranes or skin necrosis: Unknown Has patient had a PCN reaction that required hospitalization: Unknown Has patient had a PCN reaction occurring within the last 10 years: Unknown If all of the above answers are "NO", then may proceed with Cephalosporin  use.        Medication List     STOP taking these medications    tiZANidine 2 MG tablet Commonly known as: ZANAFLEX       TAKE these medications    acetaminophen 500 MG tablet Commonly known as: TYLENOL Take 1,500 mg by mouth every 8 (eight) hours as needed for moderate pain.   atorvastatin 20 MG tablet Commonly known as: LIPITOR Take 20 mg by mouth daily.   Farxiga 10 MG Tabs tablet Generic drug: dapagliflozin propanediol Take 10 mg by mouth daily.   gabapentin 300 MG capsule Commonly known as: NEURONTIN Take 300 mg by mouth at bedtime.   metFORMIN 500 MG 24 hr tablet Commonly known as: GLUCOPHAGE-XR Take 1,000 mg by mouth 2 (two) times daily.   methocarbamol 500 MG tablet Commonly known as: Robaxin Take 1 tablet (500 mg total) by mouth every 8 (eight) hours as needed for muscle spasms.   oxyCODONE 5 MG immediate release tablet Commonly known as: Roxicodone Take 1 tablet (5 mg total) by mouth every 4 (four) hours as needed for up to 5 days for severe pain.   Ozempic (0.25 or 0.5 MG/DOSE) 2 MG/1.5ML Sopn Generic drug: Semaglutide(0.25 or 0.5MG /DOS) Inject 0.5 mg into the skin every Sunday.   pioglitazone 15 MG tablet Commonly known as: ACTOS Take 15 mg by mouth daily.   senna 8.6 MG Tabs tablet Commonly known as: SENOKOT Take 1 tablet (8.6 mg total) by mouth daily as needed for mild constipation.   valsartan-hydrochlorothiazide 160-12.5 MG tablet Commonly known as: DIOVAN-HCT Take 1 tablet by mouth daily.  Follow-up Information     Susanne Borders, PA Follow up in 2 week(s).   Why: for post-op f/u. Contact information: [address] [place] [address] [phone]                 Signed: Susanne Borders 05/14/2021, 9:23 AM

## 2021-05-14 NOTE — Transfer of Care (Signed)
Immediate Anesthesia Transfer of Care Note  Patient: Lori Calhoun  Procedure(s) Performed: C5-6 ANTERIOR CERVICAL DISCECTOMY AND FUSION (GLOBUS HEDRON) (Spine Cervical)  Patient Location: PACU  Anesthesia Type:General  Level of Consciousness: awake  Airway & Oxygen Therapy: Patient Spontanous Breathing and Patient connected to face mask  Post-op Assessment: Report given to RN and Post -op Vital signs reviewed and stable  Post vital signs: Reviewed and stable  Last Vitals:  Vitals Value Taken Time  BP 125/74   Temp 35.9   Pulse 76 05/14/21 0931  Resp 16 05/14/21 0931  SpO2 100 % 05/14/21 0931  Vitals shown include unvalidated device data.  Last Pain:  Vitals:   05/14/21 0638  TempSrc: Temporal  PainSc: 4       Patients Stated Pain Goal: 0 (05/14/21 1761)  Complications: No notable events documented.

## 2021-05-14 NOTE — Progress Notes (Signed)
Patient began c/o numbness/tingling sensation to left hand/forearm.  On arrival to post op sensation full in both upper extremities.  When getting dressed pt feels her left arm is weaker than before surgery.  Grip strength remains same as on arrival to post op.  Notified Lori Calhoun/dr Myer Haff and Danielle PA at bedside to assess pt and reports same assessment as prior and likely nerve irritation and ok for pt to be discharged.  Pt comfortable with this.

## 2021-05-14 NOTE — Op Note (Signed)
Indications: Lori Calhoun is suffering from Cervical myelopathy G95.9, Spinal stenosis in cervical region M48.02.   she failed conservative management, and elected to proceed with surgery.  Findings: severe stenosis  Preoperative Diagnosis: Cervical myelopathy G95.9, Spinal stenosis in cervical region M48.02 Postoperative Diagnosis: same   EBL: 50 ml IVF: 1200 ml Drains: none Disposition: Extubated and Stable to PACU Complications: none  No foley catheter was placed.   Preoperative Note:   Risks of surgery discussed include: infection, bleeding, stroke, coma, death, paralysis, CSF leak, nerve/spinal cord injury, numbness, tingling, weakness, complex regional pain syndrome, recurrent stenosis and/or disc herniation, vascular injury, development of instability, neck/back pain, need for further surgery, persistent symptoms, development of deformity, and the risks of anesthesia. The patient understood these risks and agreed to proceed.  Operative Note:   Procedure:  1) Anterior cervical diskectomy and fusion at C5-6 2) Anterior cervical instrumentation at C5-6 using Globus Xtend 3) Insertion of biomechanical device at C5-6   Procedure: After obtaining informed consent, the patient taken to the operating room, placed in supine position, general anesthesia induced.  The patient had a small shoulder roll placed behind their shoulders.  The patient received preop antibiotics and IV Decadron.  The patient had a neck incision outlined, was prepped and draped in usual sterile fashion. The incision was injected with local anesthetic.   An incision was opened, dissection taken down medial to the carotid artery and jugular vein, lateral to the trachea and esophagus.  The prevertebral fascia was identified, and a localizing x-ray demonstrated the correct level.  The C6-7 plate was identified.  A large anterior osteophyte was noted from C4 to C6.  The longus colli were dissected laterally, and  self-retaining retractors placed to open the operative field. The microscope was then brought into the field.  With this complete, a portion of the C5 osteophyte was removed and distractor pins were placed in the vertebral bodies of C5 and C6. The distractor was placed, and the annulus at C5/6 was opened using a bovie.  Curettes and pituitary rongeurs used to remove the majority of disk, then the drill was used to remove the posterior osteophyte and begin the foraminotomies. The nerve hook was used to elevate the posterior longitudinal ligament, which was then removed with Kerrison rongeurs. Bilateral foraminotomies were performed. The microblunt nerve hook could be passed out the foramen bilaterally.   Meticulous hemostasis was obtained.  A biomechanical device (Globus Hedron C 7 mm height x 14 mm width by 12 mm depth) was placed at C5/6. The device had been filled with allograft for aid in arthrodesis.  The caspar distractor was removed, and bone wax used for hemostasis. A 10 mm Globus Xtend plate was chosen.  Two screws placed in each vertebral body, respectively making sure the screws were behind the locking mechanism.  Final AP and lateral radiographs were taken.   With everything in good position, the wound was irrigated copiously with bacitracin-containing solution and meticulous hemostasis obtained.  Wound was closed in 2 layers using interrupted inverted 3-0 Vicryl sutures.  The wound was dressed with dermabond, the head of bed at 30 degrees, taken to recovery room in stable condition.  No new postop neurological deficits were identified.  Sponge and pattie counts were correct at the end of the procedure.   Monitoring was stable throughout.  I performed the entire procedure with the assistance of Cooper Render PA as an Pensions consultant.  Meade Maw MD

## 2021-05-14 NOTE — Anesthesia Procedure Notes (Signed)
Procedure Name: Intubation Date/Time: 05/14/2021 7:23 AM Performed by: Cammie Sickle, CRNA Pre-anesthesia Checklist: Patient identified, Emergency Drugs available, Suction available and Patient being monitored Patient Re-evaluated:Patient Re-evaluated prior to induction Oxygen Delivery Method: Circle system utilized Preoxygenation: Pre-oxygenation with 100% oxygen Induction Type: IV induction Ventilation: Mask ventilation without difficulty Laryngoscope Size: McGraph and 3 Grade View: Grade I Tube type: Oral Tube size: 7.5 mm Number of attempts: 1 Airway Equipment and Method: Stylet and Oral airway Placement Confirmation: ETT inserted through vocal cords under direct vision, positive ETCO2 and breath sounds checked- equal and bilateral Secured at: 22 cm Tube secured with: Tape Dental Injury: Teeth and Oropharynx as per pre-operative assessment

## 2021-05-14 NOTE — Progress Notes (Signed)
Patient in post op room 1.  Will remain here in observation until 4 hours out of surgery.  Patient denies needs at this time.  Was made comfortable in reliner and is aware of what she must be able to do for discharge.  Does not want to eat anything at this time.

## 2021-05-14 NOTE — Anesthesia Preprocedure Evaluation (Signed)
Anesthesia Evaluation  Patient identified by MRN, date of birth, ID band Patient awake    Reviewed: Allergy & Precautions, NPO status , Patient's Chart, lab work & pertinent test results  History of Anesthesia Complications Negative for: history of anesthetic complications  Airway Mallampati: III   Neck ROM: Full    Dental  (+)    Pulmonary former smoker (quit 2010),    Pulmonary exam normal breath sounds clear to auscultation       Cardiovascular hypertension, Normal cardiovascular exam Rhythm:Regular Rate:Normal  ECG 05/07/21: normal   Neuro/Psych Cervical stenosis  Neuromuscular disease (neuropathy)    GI/Hepatic negative GI ROS,   Endo/Other  diabetes, Type 2Obesity   Renal/GU Renal disease (stage III CKD)     Musculoskeletal   Abdominal   Peds  Hematology negative hematology ROS (+)   Anesthesia Other Findings   Reproductive/Obstetrics                             Anesthesia Physical Anesthesia Plan  ASA: 2  Anesthesia Plan: General   Post-op Pain Management:    Induction: Intravenous  PONV Risk Score and Plan: 3 and Treatment may vary due to age or medical condition and Ondansetron  Airway Management Planned: Oral ETT  Additional Equipment:   Intra-op Plan:   Post-operative Plan: Extubation in OR  Informed Consent: I have reviewed the patients History and Physical, chart, labs and discussed the procedure including the risks, benefits and alternatives for the proposed anesthesia with the patient or authorized representative who has indicated his/her understanding and acceptance.     Dental advisory given  Plan Discussed with: CRNA  Anesthesia Plan Comments: (Patient consented for risks of anesthesia including but not limited to:  - adverse reactions to medications - damage to eyes, teeth, lips or other oral mucosa - nerve damage due to positioning  - sore throat  or hoarseness - damage to heart, brain, nerves, lungs, other parts of body or loss of life  Informed patient about role of CRNA in peri- and intra-operative care.  Patient voiced understanding.)        Anesthesia Quick Evaluation

## 2021-05-14 NOTE — Progress Notes (Signed)
Patient has eaten applesauce, ambulated from post op 1 to bathroom, and urinated.  Will keep patient until 1327 for a minimum of 4 hours from arrival to pacu.

## 2021-05-14 NOTE — Discharge Instructions (Addendum)
?Your surgeon has performed an operation on your cervical spine (neck) to relieve pressure on the spinal cord and/or nerves. This involved making an incision in the front of your neck and removing one or more of the discs that support your spine. Next, a small piece of bone, a titanium plate, and screws were used to fuse two or more of the vertebrae (bones) together. ? ?The following are instructions to help in your recovery once you have been discharged from the hospital. Even if you feel well, it is important that you follow these activity guidelines. If you do not let your neck heal properly from the surgery, you can increase the chance of return of your symptoms and other complications. ? ?* Do not take anti-inflammatory medications for 3 months after surgery (naproxen [Aleve], ibuprofen [Advil, Motrin], etc.). These medications can prevent your bones from healing properly. ? ?Activity  ?  ?No bending, lifting, or twisting (?BLT?). Avoid lifting objects heavier than 10 pounds (gallon milk jug).  Where possible, avoid household activities that involve lifting, bending, reaching, pushing, or pulling such as laundry, vacuuming, grocery shopping, and childcare. Try to arrange for help from friends and family for these activities while your back heals. ? ?Increase physical activity slowly as tolerated.  Taking short walks is encouraged, but avoid strenuous exercise. Do not jog, run, bicycle, lift weights, or participate in any other exercises unless specifically allowed by your doctor. ? ?Talk to your doctor before resuming sexual activity. ? ?You should not drive until cleared by your doctor. ? ?Until released by your doctor, you should not return to work or school.  You should rest at home and let your body heal.  ? ?You may shower three days after your surgery.  After showering, lightly dab your incision dry. Do not take a tub bath or go swimming until approved by your doctor at your follow-up  appointment. ? ? ?If you smoke, we strongly recommend that you quit.  Smoking has been proven to interfere with normal bone healing and will dramatically reduce the success rate of your surgery. Please contact [facility] (800-QUIT-NOW) and use the resources at www.[facility].com for assistance in stopping smoking. ? ?Surgical Incision ?  ?Keep your incision area clean and dry. ? ?Your incision was closed with Dermabond glue. The glue should begin to peel away within about a week. ? ?Diet          ? ?You may return to your usual diet. However, you may experience discomfort when swallowing in the first month after your surgery. This is normal. You may find that softer foods are more comfortable for you to swallow. Be sure to stay hydrated. ? ?When to Contact Us ? ?You may experience pain in your neck and/or pain between your shoulder blades. This is normal and should improve in the next few weeks with the help of pain medication, muscle relaxers, and rest. Some patients report that a warm compress on the back of the neck or between the shoulder blades helps. ? ?However, should you experience any of the following, contact us immediately: ?New numbness or weakness ?Pain that is progressively getting worse, and is not relieved by your pain medication, muscle relaxers, rest, and warm compresses ?Bleeding, redness, swelling, pain, or drainage from surgical incision ?Chills or flu-like symptoms ?Fever greater than 101.0 F (38.3 C) ?Inability to eat, drink fluids, or take medications ?Problems with bowel or bladder functions ?Difficulty breathing or shortness of breath ?Warmth, tenderness, or swelling in your calf ?  Contact Information ?During office hours (Monday-Friday 9 am to 5 pm), please call your physician at [phone] and ask for Kendelyn Jean ?After hours and weekends, please call [phone] and speak with the answering service, who will contact the doctor on call.  If that fails, call the [facility] Operator at  [phone] and ask for the Neurosurgery Resident On Call  ?For a life-threatening emergency, call 911 ? ?AMBULATORY SURGERY  ?DISCHARGE INSTRUCTIONS ? ? ?The drugs that you were given will stay in your system until tomorrow so for the next 24 hours you should not: ? ?Drive an automobile ?Make any legal decisions ?Drink any alcoholic beverage ? ? ?You may resume regular meals tomorrow.  Today it is better to start with liquids and gradually work up to solid foods. ? ?You may eat anything you prefer, but it is better to start with liquids, then soup and crackers, and gradually work up to solid foods. ? ? ?Please notify your doctor immediately if you have any unusual bleeding, trouble breathing, redness and pain at the surgery site, drainage, fever, or pain not relieved by medication. ? ? ? ?Additional Instructions: ? ? ?Please contact your physician with any problems or Same Day Surgery at [phone], Monday through Friday 6 am to 4 pm, or [facility] Main number at [phone].  ?  ?

## 2021-05-14 NOTE — Anesthesia Postprocedure Evaluation (Signed)
Anesthesia Post Note  Patient: [name]  Procedure(s) Performed: C5-6 ANTERIOR CERVICAL DISCECTOMY AND FUSION (GLOBUS HEDRON) (Spine Cervical)  Patient location during evaluation: PACU Anesthesia Type: General Level of consciousness: awake and alert Pain management: pain level controlled Vital Signs Assessment: post-procedure vital signs reviewed and stable Respiratory status: spontaneous breathing, nonlabored ventilation, respiratory function stable and patient connected to nasal cannula oxygen Cardiovascular status: blood pressure returned to baseline and stable Postop Assessment: no apparent nausea or vomiting Anesthetic complications: no   No notable events documented.   Last Vitals:  Vitals:   05/14/21 1307 05/14/21 1343  BP: (!) 153/80 (!) 158/88  Pulse: 74 75  Resp: 15 16  Temp: 36.5 C (!) 36.3 C  SpO2: 100% 98%    Last Pain:  Vitals:   05/14/21 1307  TempSrc:   PainSc: 2                  Lenard Simmer

## 2021-05-14 NOTE — H&P (Signed)
I have reviewed and confirmed my history and physical from 05/03/2021 with no additions or changes. Plan for C5-6 ACDF.  Risks and benefits reviewed.  Heart sounds normal no MRG. Chest Clear to Auscultation Bilaterally.

## 2021-05-15 ENCOUNTER — Encounter: Payer: Self-pay | Admitting: Neurosurgery

## 2021-06-07 ENCOUNTER — Encounter: Payer: Self-pay | Admitting: Physical Therapy

## 2021-06-07 ENCOUNTER — Other Ambulatory Visit: Payer: Self-pay

## 2021-06-07 ENCOUNTER — Ambulatory Visit: Payer: BC Managed Care – PPO | Attending: Neurosurgery | Admitting: Physical Therapy

## 2021-06-07 ENCOUNTER — Ambulatory Visit
Admission: RE | Admit: 2021-06-07 | Discharge: 2021-06-07 | Disposition: A | Discharge status: Home / Self Care | Payer: BC Managed Care – PPO | Source: Ambulatory Visit | Attending: Internal Medicine | Admitting: Internal Medicine

## 2021-06-07 DIAGNOSIS — M6281 Muscle weakness (generalized): Secondary | ICD-10-CM | POA: Diagnosis present

## 2021-06-07 DIAGNOSIS — Z1231 Encounter for screening mammogram for malignant neoplasm of breast: Secondary | ICD-10-CM | POA: Insufficient documentation

## 2021-06-07 DIAGNOSIS — M25612 Stiffness of left shoulder, not elsewhere classified: Secondary | ICD-10-CM | POA: Diagnosis present

## 2021-06-07 IMAGING — MG MM DIGITAL SCREENING BILAT W/ TOMO AND CAD
6 of 12 series · 6 of 36 positions shown · non-contrast
Comparison: Previous exam(s).

CLINICAL DATA: Screening.

EXAM:
DIGITAL SCREENING BILATERAL MAMMOGRAM WITH TOMOSYNTHESIS AND CAD
TECHNIQUE: Bilateral screening digital craniocaudal and mediolateral oblique
mammograms were obtained. Bilateral screening digital breast
tomosynthesis was performed. The images were evaluated with
computer-aided detection.

[R CV synth-2D]
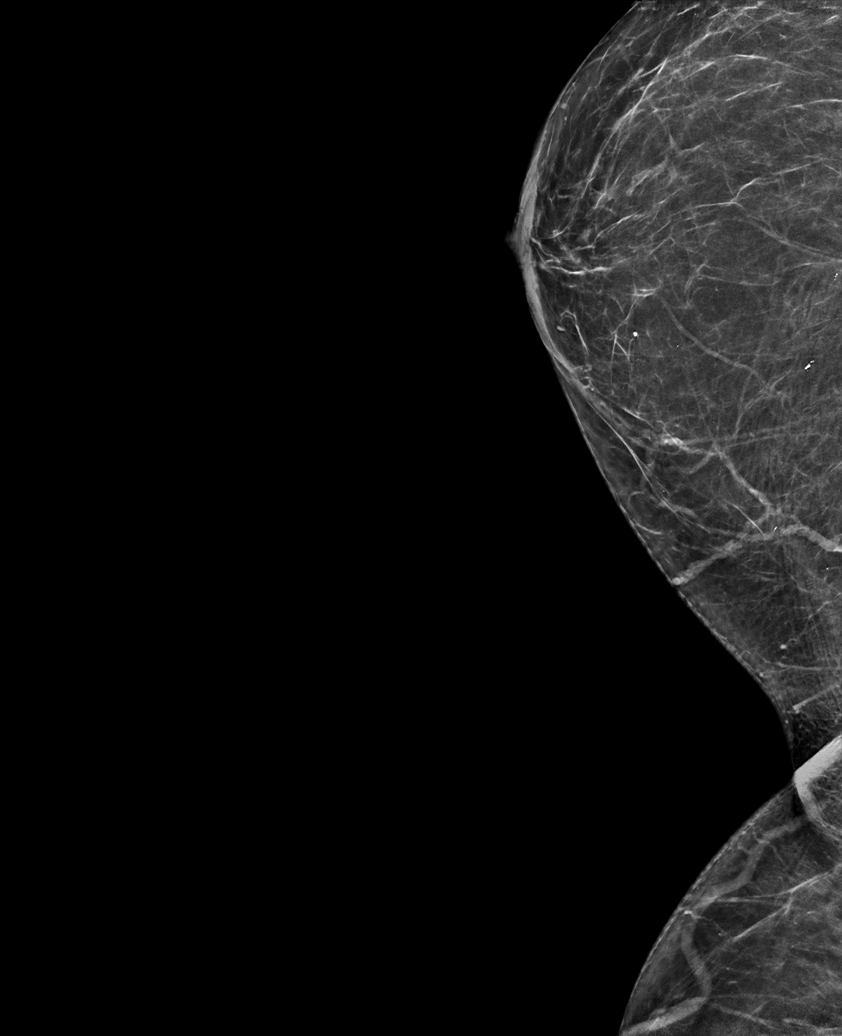

[R CC synth-2D]
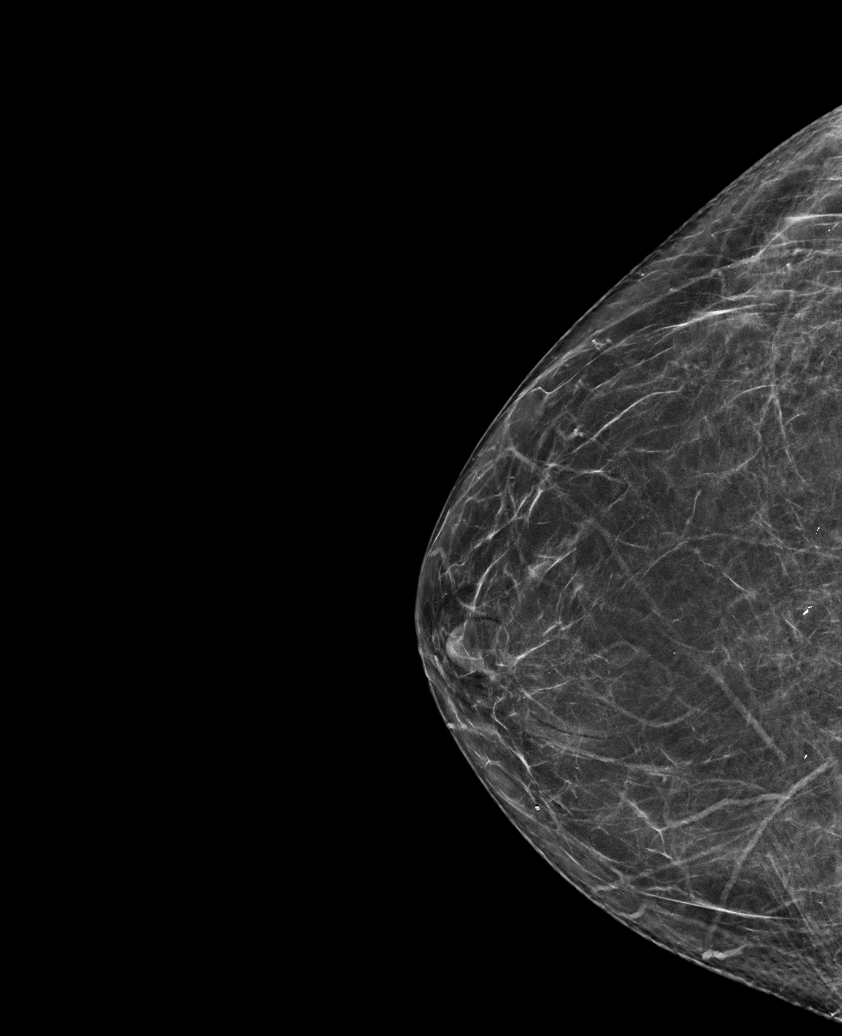

[L CV synth-2D]
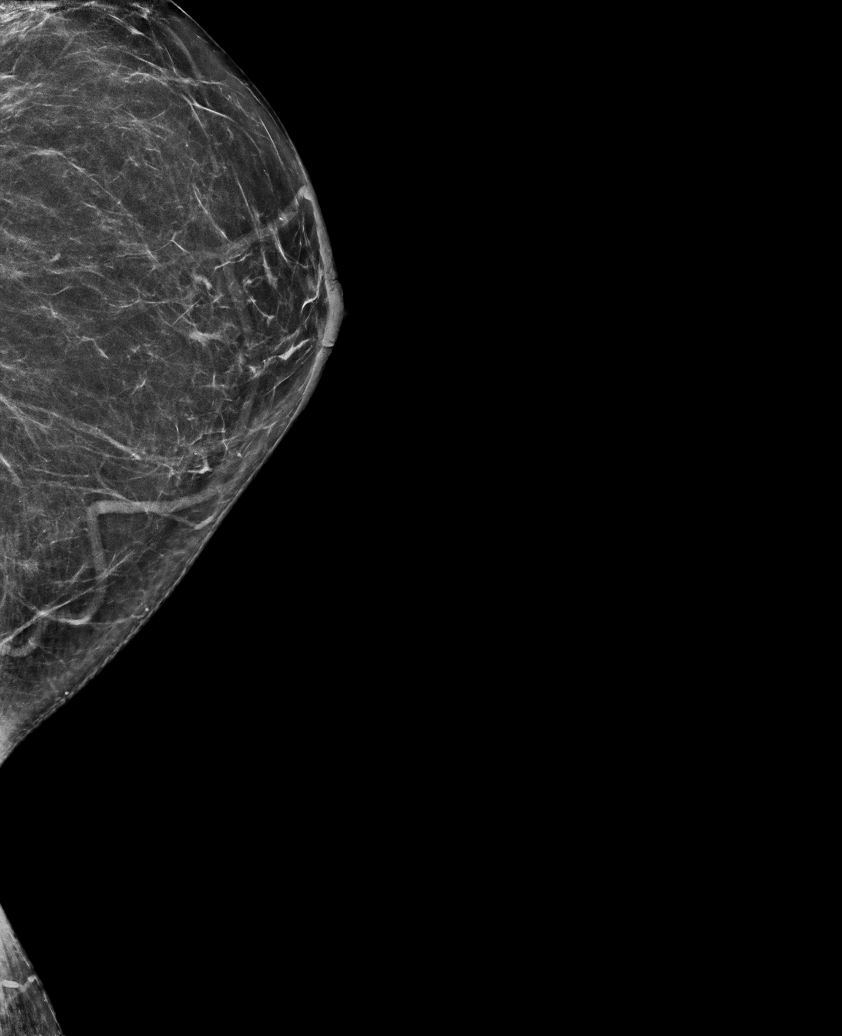

[L MLO synth-2D]
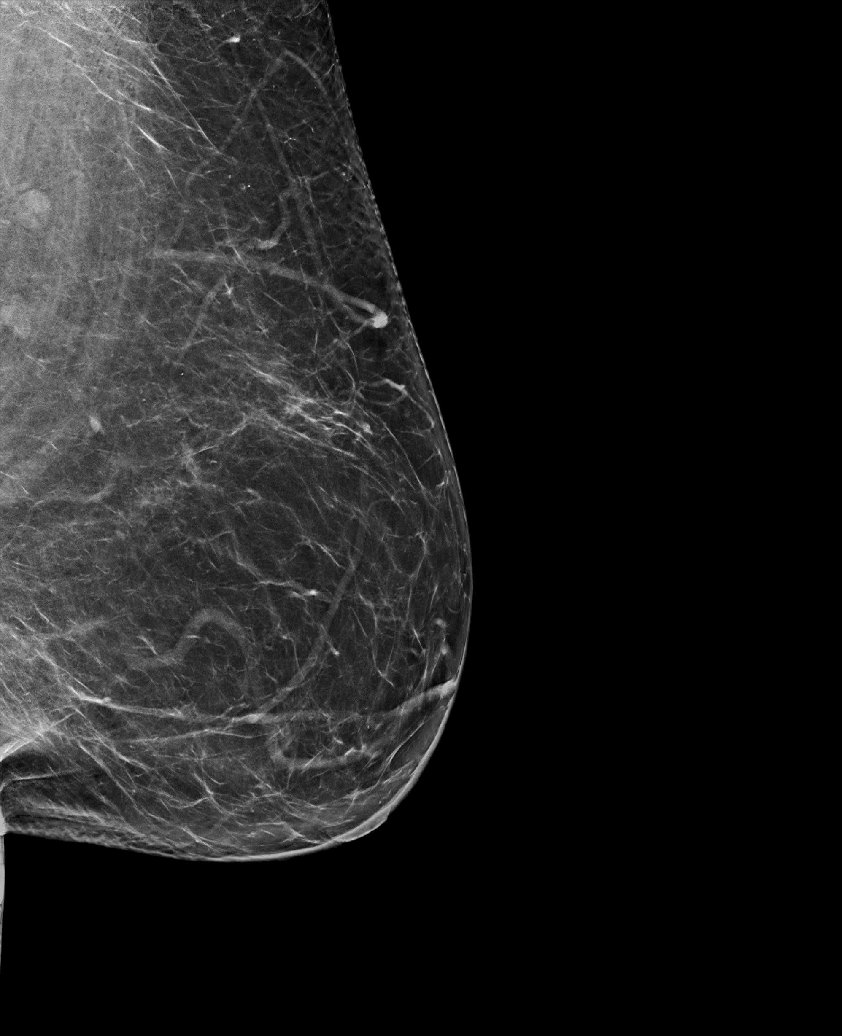

[R MLO synth-2D]
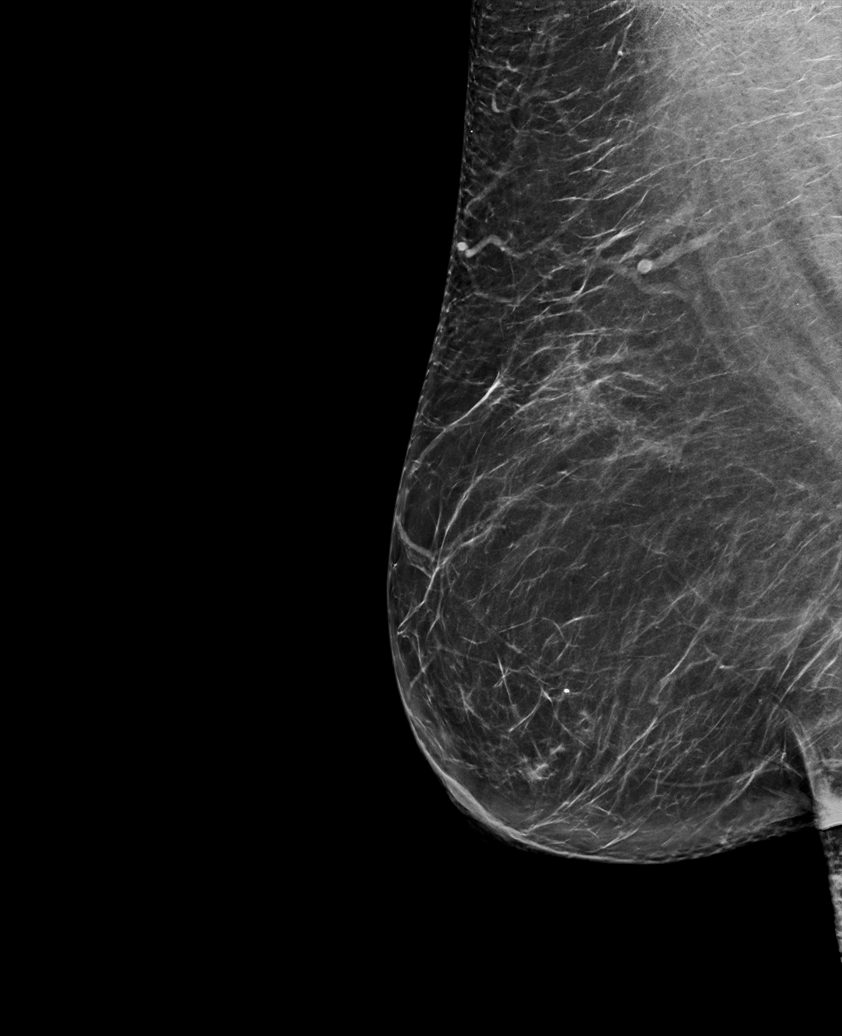

[L CC synth-2D]
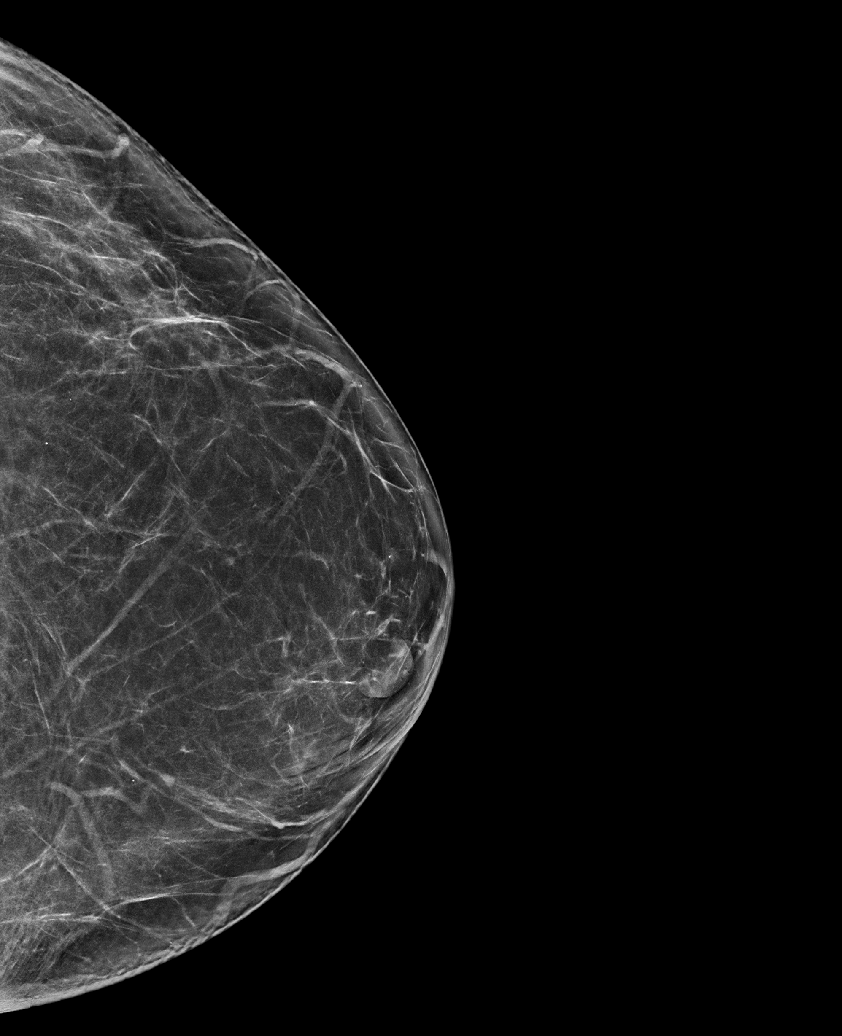

[6 of 36 positions shown; findings below may reference images not displayed]

ACR Breast Density Category b: There are scattered areas of
fibroglandular density.
FINDINGS: There are no findings suspicious for malignancy.
IMPRESSION: No mammographic evidence of malignancy. A result letter of this
screening mammogram will be mailed directly to the patient.

RECOMMENDATION:
Screening mammogram in one year. (Code:[BY])

BI-RADS CATEGORY  1: Negative.

## 2021-06-07 NOTE — Therapy (Signed)
?OUTPATIENT PHYSICAL THERAPY CERVICAL EVALUATION ? ? ?Patient Name: Lori Calhoun ?MRN: [id number] ?DOB:10-Aug-1970, 51 y.o., female ?Today's Date: 06/07/2021 ? ? PT End of Session - 06/07/21 2130   ? ? Visit Number 1   ? Number of Visits 16   ? Date for PT Re-Evaluation 08/02/21   ? Authorization Type BCBS [id number]   ? PT Start Time 1330   ? PT Stop Time [id number]   ? PT Time Calculation (min) 45 min   ? Activity Tolerance Patient tolerated treatment well   ? Behavior During Therapy [facility] for tasks assessed/performed   ? ?  ?  ? ?  ? ? ?Past Medical History:  ?Diagnosis Date  ? Diabetes mellitus, type 2 ([place])   ? Hypertension   ? Peripheral neuropathy   ? feet  ? ?Past Surgical History:  ?Procedure Laterality Date  ? adnoids  Bilateral 1979  ? AMPUTATION TOE Right 04/15/2018  ? Procedure: RAY RIGHT 5TH;  Surgeon: Samara Deist, DPM;  Location: [place];  Service: Podiatry;  Laterality: Right;  general with local ?Diabetic - oral meds  ? ANTERIOR CERVICAL DECOMP/DISCECTOMY FUSION N/A 05/14/2021  ? Procedure: C5-6 ANTERIOR CERVICAL DISCECTOMY AND FUSION (GLOBUS HEDRON);  Surgeon: Meade Maw, MD;  Location: [facility];  Service: Neurosurgery;  Laterality: N/A;  ? CERVICAL SPINE SURGERY    ? plate at [id number]  ? LAPAROSCOPIC SUPRACERVICAL HYSTERECTOMY  2014  ? right wrist surgery  2022  ? ?Patient Active Problem List  ? Diagnosis Date Noted  ? Benzodiazepine overdose 11/04/2016  ? Adjustment disorder with mixed disturbance of emotions and conduct 11/04/2016  ? Marijuana abuse 11/04/2016  ? ? ?PCP: Gladstone Lighter, MD ? ?REFERRING PROVIDER: Meade Maw, MD ? ?REFERRING DIAG: Weakness of Left  elbow extensor and Left Shoulder Stiffness  ? ?THERAPY DIAG:  ?Muscle weakness (generalized) ? ?Stiffness of left shoulder, not elsewhere classified ? ?ONSET DATE: 05/14/21 ? ?SUBJECTIVE:                                                                                                                                                                                                         ? ?SUBJECTIVE STATEMENT: ?Pt reports that she wants to have additional therapy to regain the range of motion in her shoulders especially when reaching overhead and controlling arms. She would also like to work on her left hand. She reports that she had experienced reduced shoulder mobility and that she is unable to extend elbow since having cervical fusion. Before surgery she was able to extend her left elbow. 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   ? ?PERTINENT HISTORY:  ?05/03/21 Per Dr. Rhea Bleacher Note  ?Assessment and Plan: ?Ms. Gasner is a pleasant 51 y.o. female with cervical myelopathy due to cervical stenosis at C5-6 from adjacent segment disease. She has worsening symptoms over the past 2 months. She has objective findings on exam including weakness of her left arm as well as hyperreflexia. She is unable to do tandem walk and has loss of balance. ? ?There is no role for conservative management and cervical myelopathy. I recommended surgical intervention with a C5-6 anterior cervical discectomy and fusion. We will send her for vocal cord clearance. ? ?I discussed the planned procedure at length with the patient, including the risks, benefits, alternatives, and indications. The risks discussed include but are not limited to bleeding, infection, need for reoperation, spinal fluid leak, stroke, vision loss, anesthetic complication, coma, paralysis, and even death. We also discussed the possibility of post-operative dysphagia, vocal cord paralysis, and the risk of adjacent segment disease in the future. I also described in detail that improvement was not guaranteed. ? ?The patient expressed understanding of these risks, and asked that we proceed with surgery. I described the surgery in layman's terms, and gave ample opportunity  for questions, which were answered to the best of my ability. ? ?PAIN:  ?Are you having pain? No ? ?PRECAUTIONS: Do not lift more than 5 lbs until 6 weeks post op  ? ?WEIGHT BEARING RESTRICTIONS  N/a ? ?FALLS:  ?Has patient fallen in last 6 months? No ?Number of falls: 0 ? ?LIVING ENVIRONMENT: ?Lives with: lives with their spouse ?Lives in: House/apartment ?Stairs: Yes: External: 1 steps; none ?Has following equipment at home: None ? 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  ? ?PLOF: [facility] ? ?PATIENT GOALS Want to be able to control upper extremities when reaching overhead.  ? ?OBJECTIVE:  ? ?VITALS: BP 109/74 HR 84 SpO2 100 ? ?DIAGNOSTIC FINDINGS:  ?CLINICAL DATA:  Cervicalgia M54.2 (ICD-10-CM) ?  ?EXAM: ?MRI CERVICAL SPINE WITHOUT CONTRAST ?  ?TECHNIQUE: ?Multiplanar, multisequence MR imaging of the cervical spine was ?performed. No intravenous contrast was administered. ?  ?COMPARISON:  MRI of the cervical spine 02/29/2020. ?  ?FINDINGS: ?Alignment: Similar alignment. Similar straightening with trace ?retrolisthesis of C7 on T1. Similar trace anterolisthesis of C4 on ?C5. ?  ?Vertebrae: C6-C7 ACDF with solid bony fusion. No focal marrow edema ?chest acute fracture, discitis/osteomyelitis, or suspicious bone ?lesion. ?  ?Cord: Normal cord signal. ?  ?Posterior Fossa, vertebral arteries, paraspinal tissues: Visualized ?vertebral artery flow voids are maintained. Unremarkable paraspinal ?soft tissues without evidence of edema. No evidence of acute ?abnormality in the visualized posterior fossa on limited assessment. ?  ?Disc levels: ?  ?C2-C3: Mild uncovertebral hypertrophy with bilateral facet ?arthropathy. No significant stenosis. ?  ?C3-C4: Posterior disc osteophyte complex, eccentric to the left with ?left greater than right facet and uncovertebral hypertrophy. ?Resulting similar versus mildly progressed moderate left foraminal ?stenosis with  potential left C4 nerve impingement. Similar mild ?right foraminal stenosis and mild canal stenosis. ?  ?C4-C5: Posterior disc osteophyte complex with small central disc ?protrusion which contacts and flattens the ventral cord. Central ?disc protrusion is mildly progressed with overall similar mild canal ?stenosis. Similar mild left foraminal stenosis with patent right ?foramina. ?  ?C5-C6: Left eccentric posterior  disc osteophyte complex with left ?greater than right facet and uncovertebral hypertrophy. Similar ?resulting moderate to severe spinal canal stenosis with flattening ?of the cord. Similar severe left greater than right foraminal ?stenosis. ?  ?C6-C7: Prior ACDF with patent canal. At least mild bilateral ?foraminal stenosis due to spurring, similar. ?  ?C7-T1: Left eccentric posterior disc osteophyte complex with left ?greater than right facet and uncovertebral hypertrophy. Similar ?severe left and mild-to-moderate right foraminal stenosis with ?potential impingement of the exiting left C8 nerve. Similar mild ?canal stenosis. ?  ?IMPRESSION: ?1. At C5-C6, similar moderate to severe canal stenosis and severe ?bilateral neural foraminal stenosis. ?2. At C7-T1, similar severe left foraminal stenosis. ?3. At C3-C4, similar versus mildly progressed moderate left ?foraminal stenosis. ?4. Additional milder multilevel degenerative change is detailed ?above. ?  ?  ?Electronically Signed ?  By: Margaretha Sheffield M.D. ?  On: 05/02/2021 09:53 ? ?CLINICAL DATA:  Fluoroscopic assistance for cervical spine fusion ?  ?EXAM: ?CERVICAL SPINE - 2-3 VIEW ?  ?COMPARISON:  None. ?  ?FINDINGS: ?Fluoroscopic images show interval anterior fusion at C5-C6 level. ?There is previous anterior fusion at C6-C7 level. Fluoroscopic time ?was 9 seconds. Estimated radiation dose is 3.27 mGy. ?  ?IMPRESSION: ?Fluoroscopic assistance was provided for cervical spinal fusion at ?C5-C6 level. ?  ?  ?Electronically Signed ?  By: Elmer Picker M.D. ?  On: 05/14/2021 10:51 ? ?PATIENT SURVEYS:  ?FOTO 40 ? ? ?COGNITION: ?Overall cognitive status: Within functional limits for tasks assessed ? ? ?SENSATION: ?Light touch: Impaired numbness in ti

## 2021-06-14 ENCOUNTER — Ambulatory Visit: Payer: BC Managed Care – PPO | Admitting: Physical Therapy

## 2021-06-14 DIAGNOSIS — M25612 Stiffness of left shoulder, not elsewhere classified: Secondary | ICD-10-CM

## 2021-06-14 DIAGNOSIS — M6281 Muscle weakness (generalized): Secondary | ICD-10-CM | POA: Diagnosis not present

## 2021-06-14 NOTE — Therapy (Signed)
?OUTPATIENT PHYSICAL THERAPY TREATMENT NOTE ? ? ?Patient Name: Lori Calhoun ?MRN: [id number] ?DOB:07/20/1970, 51 y.o., female ?Today's Date: 06/14/2021 ? ?PCP: Enid Baas, MD ?REFERRING PROVIDER: Venetia Night, MD ? ? PT End of Session - 06/14/21 1955   ? ? Visit Number 2   ? Number of Visits 16   ? Date for PT Re-Evaluation 08/02/21   ? Authorization Type BCBS [id number]   ? PT Start Time 1415   ? PT Stop Time 1500   ? PT Time Calculation (min) 45 min   ? Activity Tolerance Patient tolerated treatment well   ? Behavior During Therapy [facility] for tasks assessed/performed   ? ?  ?  ? ?  ? ? ?Past Medical History:  ?Diagnosis Date  ? Diabetes mellitus, type 2 ([facility])   ? Hypertension   ? Peripheral neuropathy   ? feet  ? ?Past Surgical History:  ?Procedure Laterality Date  ? adnoids  Bilateral 1979  ? AMPUTATION TOE Right 04/15/2018  ? Procedure: RAY RIGHT 5TH;  Surgeon: Gwyneth Revels, DPM;  Location: [facility];  Service: Podiatry;  Laterality: Right;  general with local ?Diabetic - oral meds  ? ANTERIOR CERVICAL DECOMP/DISCECTOMY FUSION N/A 05/14/2021  ? Procedure: C5-6 ANTERIOR CERVICAL DISCECTOMY AND FUSION (GLOBUS HEDRON);  Surgeon: Venetia Night, MD;  Location: [facility];  Service: Neurosurgery;  Laterality: N/A;  ? CERVICAL SPINE SURGERY    ? plate at [id number]  ? LAPAROSCOPIC SUPRACERVICAL HYSTERECTOMY  2014  ? right wrist surgery  2022  ? ?Patient Active Problem List  ? Diagnosis Date Noted  ? Benzodiazepine overdose 11/04/2016  ? Adjustment disorder with mixed disturbance of emotions and conduct 11/04/2016  ? Marijuana abuse 11/04/2016  ? ? ?REFERRING DIAG: Weakness of Left  elbow extensor and Left Shoulder Stiffness  ? ?THERAPY DIAG:  ?Muscle weakness (generalized) ? ?Stiffness of left shoulder, not elsewhere classified ? ?PERTINENT HISTORY: 05/03/21 Per Dr. Lucienne Capers Note  ?Assessment and Plan: ?[name] is a pleasant 51 y.o. female with cervical myelopathy due to cervical stenosis at C5-6 from  adjacent segment disease. She has worsening symptoms over the past 2 months. She has objective findings on exam including weakness of her left arm as well as hyperreflexia. She is unable to do tandem walk and has loss of balance. ? ?There is no role for conservative management and cervical myelopathy. I recommended surgical intervention with a C5-6 anterior cervical discectomy and fusion. We will send her for vocal cord clearance. ? ?I discussed the planned procedure at length with the patient, including the risks, benefits, alternatives, and indications. The risks discussed include but are not limited to bleeding, infection, need for reoperation, spinal fluid leak, stroke, vision loss, anesthetic complication, coma, paralysis, and even death. We also discussed the possibility of post-operative dysphagia, vocal cord paralysis, and the risk of adjacent segment disease in the future. I also described in detail that improvement was not guaranteed. ? ?The patient expressed understanding of these risks, and asked that we proceed with surgery. I described the surgery in layman's terms, and gave ample opportunity for questions, which were answered to the best of my ability. ? ?PRECAUTIONS: Do not lift >10 lbs  ? ?SUBJECTIVE: Pt reports that she continues to experience weakness in her left triceps muscle and she is unable to extend her elbow when reaching overhead. She has reached out to neurosurgeon who advised her to continue PT and that he would reassess her on April 11th.   ? ?PAIN:  ?Are you  having pain? No ? ? ?OBJECTIVE:  ?  ?VITALS: BP 109/74 HR 84 SpO2 100 ?  ?DIAGNOSTIC FINDINGS:  ?CLINICAL DATA:  Cervicalgia M54.2 (ICD-10-CM) ?  ?EXAM: ?MRI CERVICAL SPINE WITHOUT CONTRAST ?  ?TECHNIQUE: ?Multiplanar, multisequence MR imaging of the cervical spine was ?performed. No intravenous contrast was administered. ?  ?COMPARISON:  MRI of the cervical spine 02/29/2020. ?  ?FINDINGS: ?Alignment: Similar alignment. Similar  straightening with trace ?retrolisthesis of C7 on T1. Similar trace anterolisthesis of C4 on ?C5. ?  ?Vertebrae: C6-C7 ACDF with solid bony fusion. No focal marrow edema ?chest acute fracture, discitis/osteomyelitis, or suspicious bone ?lesion. ?  ?Cord: Normal cord signal. ?  ?Posterior Fossa, vertebral arteries, paraspinal tissues: Visualized ?vertebral artery flow voids are maintained. Unremarkable paraspinal ?soft tissues without evidence of edema. No evidence of acute ?abnormality in the visualized posterior fossa on limited assessment. ?  ?Disc levels: ?  ?C2-C3: Mild uncovertebral hypertrophy with bilateral facet ?arthropathy. No significant stenosis. ?  ?C3-C4: Posterior disc osteophyte complex, eccentric to the left with ?left greater than right facet and uncovertebral hypertrophy. ?Resulting similar versus mildly progressed moderate left foraminal ?stenosis with potential left C4 nerve impingement. Similar mild ?right foraminal stenosis and mild canal stenosis. ?  ?C4-C5: Posterior disc osteophyte complex with small central disc ?protrusion which contacts and flattens the ventral cord. Central ?disc protrusion is mildly progressed with overall similar mild canal ?stenosis. Similar mild left foraminal stenosis with patent right ?foramina. ?  ?C5-C6: Left eccentric posterior disc osteophyte complex with left ?greater than right facet and uncovertebral hypertrophy. Similar ?resulting moderate to severe spinal canal stenosis with flattening ?of the cord. Similar severe left greater than right foraminal ?stenosis. ?  ?C6-C7: Prior ACDF with patent canal. At least mild bilateral ?foraminal stenosis due to spurring, similar. ?  ?C7-T1: Left eccentric posterior disc osteophyte complex with left ?greater than right facet and uncovertebral hypertrophy. Similar ?severe left and mild-to-moderate right foraminal stenosis with ?potential impingement of the exiting left C8 nerve. Similar mild ?canal stenosis. ?   ?IMPRESSION: ?1. At C5-C6, similar moderate to severe canal stenosis and severe ?bilateral neural foraminal stenosis. ?2. At C7-T1, similar severe left foraminal stenosis. ?3. At C3-C4, similar versus mildly progressed moderate left ?foraminal stenosis. ?4. Additional milder multilevel degenerative change is detailed ?above. ?  ?  ?Electronically Signed ?  By: Feliberto HartsFrederick S Jones M.D. ?  On: 05/02/2021 09:53 ?  ?CLINICAL DATA:  Fluoroscopic assistance for cervical spine fusion ?  ?EXAM: ?CERVICAL SPINE - 2-3 VIEW ?  ?COMPARISON:  None. ?  ?FINDINGS: ?Fluoroscopic images show interval anterior fusion at C5-C6 level. ?There is previous anterior fusion at C6-C7 level. Fluoroscopic time ?was 9 seconds. Estimated radiation dose is 3.27 mGy. ?  ?IMPRESSION: ?Fluoroscopic assistance was provided for cervical spinal fusion at ?C5-C6 level. ?  ?  ?Electronically Signed ?  By: Ernie AvenaPalani  Rathinasamy M.D. ?  On: 05/14/2021 10:51 ?  ?PATIENT SURVEYS:  ?FOTO 40 ?  ?  ?COGNITION: ?Overall cognitive status: Within functional limits for tasks assessed ?  ?  ?SENSATION: ?Light touch: Impaired numbness in tingling in posterior side of hand and along posterior side of elbow and forearm along radial nerve distribution.  ?  ?POSTURE:  ?Forward head and rounded shoulders  ?  ?            ?  ?CERVICAL ROM:  ?  ?Active ROM A/PROM (deg) ?06/07/2021  ?Flexion 30  ?Extension 30  ?Right lateral flexion 30  ?Left lateral flexion 30  ?  Right rotation 30  ?Left rotation 30  ? (Blank rows = not tested) ?  ?UE ROM: ?  ?AROM + PROM ROM Right ?06/07/2021 Left ?06/07/2021  ?Shoulder flexion      ?Shoulder extension 180 120  ?Shoulder abduction 180 120  ?Shoulder adduction      ?Shoulder extension 60 60  ?Shoulder internal rotation      ?Shoulder external rotation      ?Elbow flexion      ?Elbow extension      ?Wrist flexion 0 70  ?Wrist extension 0 80  ?Wrist ulnar deviation      ?Wrist radial deviation      ?Wrist pronation      ?Wrist supination      ?  (Blank rows = not tested) ?  ?UE MMT: ?  ?MMT Right ?06/07/2021 Left ?06/07/2021  ?Shoulder flexion 5/5 4/5  ?Shoulder extension      ?Shoulder abduction 5/5 5/5  ?Shoulder adduction      ?Shoulder extension      ?Sho

## 2021-06-18 ENCOUNTER — Ambulatory Visit: Payer: BC Managed Care – PPO | Admitting: Physical Therapy

## 2021-06-18 ENCOUNTER — Ambulatory Visit: Payer: BC Managed Care – PPO | Attending: Neurosurgery | Admitting: Occupational Therapy

## 2021-06-18 ENCOUNTER — Encounter: Payer: Self-pay | Admitting: Occupational Therapy

## 2021-06-18 DIAGNOSIS — M6281 Muscle weakness (generalized): Secondary | ICD-10-CM | POA: Diagnosis present

## 2021-06-18 DIAGNOSIS — M25632 Stiffness of left wrist, not elsewhere classified: Secondary | ICD-10-CM | POA: Diagnosis present

## 2021-06-18 DIAGNOSIS — M25612 Stiffness of left shoulder, not elsewhere classified: Secondary | ICD-10-CM | POA: Insufficient documentation

## 2021-06-18 DIAGNOSIS — M25642 Stiffness of left hand, not elsewhere classified: Secondary | ICD-10-CM | POA: Insufficient documentation

## 2021-06-18 NOTE — Therapy (Signed)
?OUTPATIENT OCCUPATIONAL THERAPY ORTHO EVALUATION ? ?Patient Name: Lori Calhoun ?MRN: [id number] ?DOB:1970-03-21, 51 y.o., female ?Today's Date: 06/18/2021 ? ?PCP: Enid Baas, MD ?REFERRING PROVIDER: Venetia Night, MD ? ? OT End of Session - 06/18/21 1758   ? ? Visit Number 1   ? Number of Visits 8   ? Date for OT Re-Evaluation 09/10/21   ? OT Start Time 1145   ? OT Stop Time 1250   ? OT Time Calculation (min) 65 min   ? Activity Tolerance Patient tolerated treatment well   ? Behavior During Therapy [facility] for tasks assessed/performed   ? ?  ?  ? ?  ? ? ?Past Medical History:  ?Diagnosis Date  ? Diabetes mellitus, type 2 (HCC)   ? Hypertension   ? Peripheral neuropathy   ? feet  ? ?Past Surgical History:  ?Procedure Laterality Date  ? adnoids  Bilateral 1979  ? AMPUTATION TOE Right 04/15/2018  ? Procedure: RAY RIGHT 5TH;  Surgeon: Gwyneth Revels, DPM;  Location: [facility];  Service: Podiatry;  Laterality: Right;  general with local ?Diabetic - oral meds  ? ANTERIOR CERVICAL DECOMP/DISCECTOMY FUSION N/A 05/14/2021  ? Procedure: C5-6 ANTERIOR CERVICAL DISCECTOMY AND FUSION (GLOBUS HEDRON);  Surgeon: Venetia Night, MD;  Location: [facility];  Service: Neurosurgery;  Laterality: N/A;  ? CERVICAL SPINE SURGERY    ? plate at V7-C5  ? LAPAROSCOPIC SUPRACERVICAL HYSTERECTOMY  2014  ? right wrist surgery  2022  ? ?Patient Active Problem List  ? Diagnosis Date Noted  ? Benzodiazepine overdose 11/04/2016  ? Adjustment disorder with mixed disturbance of emotions and conduct 11/04/2016  ? Marijuana abuse 11/04/2016  ? ? ?ONSET DATE: 05/14/21 ? ?REFERRING DIAG: s/p Cervical fusion ? ?THERAPY DIAG:  ?Muscle weakness (generalized) ? ?Stiffness of left wrist, not elsewhere classified ? ?Stiffness of left hand, not elsewhere classified ? ?SUBJECTIVE:  ? ?SUBJECTIVE STATEMENT: ?Since my surgery I weakness in my tricep overhead, and my wrist is weaker than I can remember as well as I cannot extend my middle and ring  finger.  I remember before surgery I had loss of strength in middle and ring finger but I could not remember about my wrist.  I really needs my wrist in my hand strength for my job. ?Pt accompanied by: Alone ? ?PERTINENT HISTORY: 05/03/21 Per Dr. Lucienne Capers Note  ?Assessment and Plan: ?Ms. Lori Calhoun is a pleasant 51 y.o. female with cervical myelopathy due to cervical stenosis at C5-6 from adjacent segment disease. She has worsening symptoms over the past 2 months. She has objective findings on exam including weakness of her left arm as well as hyperreflexia. She is unable to do tandem walk and has loss of balance. ? ?There is no role for conservative management and cervical myelopathy. I recommended surgical intervention with a C5-6 anterior cervical discectomy and fusion. We will send her for vocal cord clearance. ? ?I discussed the planned procedure at length with the patient, including the risks, benefits, alternatives, and indications. The risks discussed include but are not limited to bleeding, infection, need for reoperation, spinal fluid leak, stroke, vision loss, anesthetic complication, coma, paralysis, and even death. We also discussed the possibility of post-operative dysphagia, vocal cord paralysis, and the risk of adjacent segment disease in the future. I also described in detail that improvement was not guaranteed. ? ?The patient expressed understanding of these risks, and asked that we proceed with surgery. I described the surgery in layman's terms, and gave ample opportunity for questions,  which were answered to the best of my ability. ?PRECAUTIONS: Lifting precaution 5 pounds until 6 weeks postop ? ?WEIGHT BEARING RESTRICTIONS no weightbearing through left hand ?PAIN:  ?Are you having pain?  No pain ?PATIENT GOALS I want to be able to Do My Job, cut my food, lift pots, do buttons and fasten my pants, brush my teeth, squeeze a washcloth, do my hair, left the cup or glass, open packages or  jars, ? ?OBJECTIVE:  ? [facility] OT Assessment - 06/18/21 0001   ? ?  ? Assessment  ? Medical Diagnosis S/p cervical fusion   ? Referring Provider (OT) Myer HaffYarbrough   ? Onset Date/Surgical Date 05/14/21   ? Hand Dominance Right   ? Next MD Visit 11th April   ?  ? Home  Environment  ? Lives With Spouse   ?  ? Prior Function  ? Vocation Full time employment   ? 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  ? AROM  ? Overall AROM Comments decrease thumb RA, digits extention 3-/5 for 2nd and 3rd thru 5 [facility], Opposition 3+/5;   ?  ? Strength  ? Left Forearm Pronation 3-/5   ? Left Forearm Supination 3+/5   ? Left Wrist Flexion 3+/5   ? Left Wrist Extension 3/5   ? Left Wrist Radial Deviation 3+/5   ? Left Wrist Ulnar Deviation 3-/5   ? ?  ?  ? ?  ?  ? ?Denies any sensation changes ? ?TODAY'S TREATMENT: And review of home exercises: ?Three-quarter cup weight around hand for supination pronation with elbow to side, place and hold wrist extension, on tissue on table ulnar radial deviation 2 sets of 8 reps.  Sliding thumb radial abduction on paper, do light weightbearing through palm to stretch flexors of forearm and hand prior to any wrist extension and digit extension home exercises.  Had better success after flexor stretches.  Attempt tapping of digits able to do second digit and placed on hold of fifth.  8 reps 2 sets can use a wrist brace at night or during heavy activity to stabilize wrist.  And use during opposition picking up 1 or 2 cm foam block alternating digits.  I have a hard time extending fourth digit during opposition to fifth. ? ? ? ?PATIENT EDUCATION: ?Education details: Findings of evaluation, nerve healing and protocol for rehab, home exercises ?Person educated: Patient was educated  ?education method: Explanation, Demonstration, Tactile cues, Verbal cues, and Handouts ?Education comprehension: verbalized understanding, returned demonstration,  verbal cues required, and needs further education ? ? ? ?GOALS: ?Goals reviewed with patient? Yes ? ?SHORT TERM GOALS: Target date: 07/16/2021 ? ?Patient be independent to decrease flexor tone and show increase  wrist extension and digit extension ?Baseline: Wrist extension partial range has to do placed on hold, able to tap second digit unable to do second and third, fifth initiation of extension but unable to do place and hold ?Goal status: INITIAL ?LONG TERM GOALS: Target date: You ? ?Left wrist and forearm strength increased to 4+/5 10 doorknob, hold drink an open jar without wrist collapsing ?Baseline: Decreased strength in pronation more than supination, wrist extension and ulnar deviation being 3-3 minus/5 strength-compensating with abduction of elbow external rotation ?Goal status: INITIAL ? ?2.  Left digits extension increased for patient to release large cylinder up check. ?Baseline: Unable to extend second and third digits, trace of extension on  fifth able to tap the second of table after light weightbearing through palm ?Goal status: INITIAL ? ?3.  Left thumb radial abduction and opposition increase for patient to be able to do rubber band for ponytail-if needed can simulate ?Baseline: Patient unable to to show extension of third and fourth digits, trace of fifth had to cut her hair unable to do ponytail because of decreased extension of digits and thumb radial abduction ?Goal status: INITIAL ? ?4.  Left wrist extension increase for patient to squeeze toothpaste squeeze washcloth and do buttons without collapsing into flexion ?Baseline: Patient unable to stabilize neutral wrist during any gripping or pinches for ADLs ?Goal status: INITIAL ? ?5.  Assist grip and prehension strength when patient able to stabilize wrist better ?Baseline: Patient collapsed into flexion at wrist with any grip and prehension. ?Goal status: INITIAL ?ASSESSMENT: ? ?CLINICAL IMPRESSION: ?Patient presented OT evaluation postop  anterior fusion of C5-C6.  Patient referred to OT for decreased forearm wrist and hand weakness.  Patient do report she was unable or had weakness in third and fourth digits extension but cannot remember about wrist an

## 2021-06-20 ENCOUNTER — Encounter: Payer: BC Managed Care – PPO | Admitting: Physical Therapy

## 2021-06-25 ENCOUNTER — Ambulatory Visit: Payer: BC Managed Care – PPO | Admitting: Physical Therapy

## 2021-06-25 ENCOUNTER — Encounter: Payer: Self-pay | Admitting: Physical Therapy

## 2021-06-25 DIAGNOSIS — M6281 Muscle weakness (generalized): Secondary | ICD-10-CM | POA: Diagnosis not present

## 2021-06-25 DIAGNOSIS — M25612 Stiffness of left shoulder, not elsewhere classified: Secondary | ICD-10-CM

## 2021-06-25 NOTE — Therapy (Signed)
?OUTPATIENT PHYSICAL THERAPY TREATMENT NOTE ? ? ?Patient Name: Lori Calhoun ?MRN: [id number] ?DOB:July 15, 1970, 51 y.o., female ?Today's Date: 06/26/2021 ? ?PCP: Enid BaasKalisetti, Radhika, MD ?REFERRING PROVIDER: Venetia NightYarbrough, Chester, MD ? ? PT End of Session - 06/25/21 1410   ? ? Visit Number 3   ? Number of Visits 16   ? Date for PT Re-Evaluation 08/02/21   ? Authorization Type [id number]   ? PT Start Time 1335   ? PT Stop Time 1415   ? PT Time Calculation (min) 40 min   ? Activity Tolerance Patient tolerated treatment well   ? Behavior [facility] for tasks assessed/performed   ? ?  ?  ? ?  ? ? ? ?Past Medical History:  ?Diagnosis Date  ? Diabetes mellitus, type 2 (HCC)   ? Hypertension   ? Peripheral neuropathy   ? feet  ? ?Past Surgical History:  ?Procedure Laterality Date  ? adnoids  Bilateral 1979  ? AMPUTATION TOE Right 04/15/2018  ? Procedure: RAY RIGHT 5TH;  Surgeon: Lori Calhoun, Lori Calhoun, DPM;  [facility];  Service: Podiatry;  Laterality: Right;  general with local ?Diabetic - oral meds  ? ANTERIOR CERVICAL DECOMP/DISCECTOMY FUSION N/A 05/14/2021  ? Procedure: C5-6 ANTERIOR CERVICAL DISCECTOMY AND FUSION (GLOBUS HEDRON);  Surgeon: Venetia NightYarbrough, Chester, MD;  Location: [facility];  Service: Neurosurgery;  Laterality: N/A;  ? CERVICAL SPINE SURGERY    ? plate at [id number]  ? LAPAROSCOPIC SUPRACERVICAL HYSTERECTOMY  2014  ? right wrist surgery  2022  ? ?Patient Active Problem List  ? Diagnosis Date Noted  ? Benzodiazepine overdose 11/04/2016  ? Adjustment disorder with mixed disturbance of emotions and conduct 11/04/2016  ? Marijuana abuse 11/04/2016  ? ? ?REFERRING DIAG: Weakness of Left  elbow extensor and Left Shoulder Stiffness  ? ?THERAPY DIAG:  ?Muscle weakness (generalized) ? ?Stiffness of left shoulder, not elsewhere classified ? ?PERTINENT HISTORY: 05/03/21 Per Dr. Lucienne CapersYarbrough's Note  ?Assessment and Plan: ?[name] is a pleasant 51 y.o. female with cervical myelopathy due to cervical stenosis at C5-6  from adjacent segment disease. She has worsening symptoms over the past 2 months. She has objective findings on exam including weakness of her left arm as well as hyperreflexia. She is unable to do tandem walk and has loss of balance. ? ?There is no role for conservative management and cervical myelopathy. I recommended surgical intervention with a C5-6 anterior cervical discectomy and fusion. We will send her for vocal cord clearance. ? ?I discussed the planned procedure at length with the patient, including the risks, benefits, alternatives, and indications. The risks discussed include but are not limited to bleeding, infection, need for reoperation, spinal fluid leak, stroke, vision loss, anesthetic complication, coma, paralysis, and even death. We also discussed the possibility of post-operative dysphagia, vocal cord paralysis, and the risk of adjacent segment disease in the future. I also described in detail that improvement was not guaranteed. ? ?The patient expressed understanding of these risks, and asked that we proceed with surgery. I described the surgery in layman's terms, and gave ample opportunity for questions, which were answered to the best of my ability. ? ?PRECAUTIONS: Do not lift >10 lbs  ? ?SUBJECTIVE: She reports increased left shoulder pain especially when reaching overhead. She continues to be unable to extend her left elbow because of little to no muscle tricep activation. Pt is scheduled for follow-up with neurosurgeon tomorrow to discuss plan of care.  ? ?PAIN:  ?Are you having pain? No, but  when she reaches overhead she experiences a 12/10 for pain.  ? ? ?OBJECTIVE:  ?  ?VITALS: BP 109/74 HR 84 SpO2 100 ?  ?DIAGNOSTIC FINDINGS:  ?CLINICAL DATA:  Cervicalgia M54.2 (ICD-10-CM) ?  ?EXAM: ?MRI CERVICAL SPINE WITHOUT CONTRAST ?  ?TECHNIQUE: ?Multiplanar, multisequence MR imaging of the cervical spine was ?performed. No intravenous contrast was administered. ?  ?COMPARISON:  MRI of the cervical  spine 02/29/2020. ?  ?FINDINGS: ?Alignment: Similar alignment. Similar straightening with trace ?retrolisthesis of C7 on T1. Similar trace anterolisthesis of C4 on ?C5. ?  ?Vertebrae: C6-C7 ACDF with solid bony fusion. No focal marrow edema ?chest acute fracture, discitis/osteomyelitis, or suspicious bone ?lesion. ?  ?Cord: Normal cord signal. ?  ?Posterior Fossa, vertebral arteries, paraspinal tissues: Visualized ?vertebral artery flow voids are maintained. Unremarkable paraspinal ?soft tissues without evidence of edema. No evidence of acute ?abnormality in the visualized posterior fossa on limited assessment. ?  ?Disc levels: ?  ?C2-C3: Mild uncovertebral hypertrophy with bilateral facet ?arthropathy. No significant stenosis. ?  ?C3-C4: Posterior disc osteophyte complex, eccentric to the left with ?left greater than right facet and uncovertebral hypertrophy. ?Resulting similar versus mildly progressed moderate left foraminal ?stenosis with potential left C4 nerve impingement. Similar mild ?right foraminal stenosis and mild canal stenosis. ?  ?C4-C5: Posterior disc osteophyte complex with small central disc ?protrusion which contacts and flattens the ventral cord. Central ?disc protrusion is mildly progressed with overall similar mild canal ?stenosis. Similar mild left foraminal stenosis with patent right ?foramina. ?  ?C5-C6: Left eccentric posterior disc osteophyte complex with left ?greater than right facet and uncovertebral hypertrophy. Similar ?resulting moderate to severe spinal canal stenosis with flattening ?of the cord. Similar severe left greater than right foraminal ?stenosis. ?  ?C6-C7: Prior ACDF with patent canal. At least mild bilateral ?foraminal stenosis due to spurring, similar. ?  ?C7-T1: Left eccentric posterior disc osteophyte complex with left ?greater than right facet and uncovertebral hypertrophy. Similar ?severe left and mild-to-moderate right foraminal stenosis with ?potential impingement  of the exiting left C8 nerve. Similar mild ?canal stenosis. ?  ?IMPRESSION: ?1. At C5-C6, similar moderate to severe canal stenosis and severe ?bilateral neural foraminal stenosis. ?2. At C7-T1, similar severe left foraminal stenosis. ?3. At C3-C4, similar versus mildly progressed moderate left ?foraminal stenosis. ?4. Additional milder multilevel degenerative change is detailed ?above. ?  ?  ?Electronically Signed ?  By: Feliberto Harts M.D. ?  On: 05/02/2021 09:53 ?  ?CLINICAL DATA:  Fluoroscopic assistance for cervical spine fusion ?  ?EXAM: ?CERVICAL SPINE - 2-3 VIEW ?  ?COMPARISON:  None. ?  ?FINDINGS: ?Fluoroscopic images show interval anterior fusion at C5-C6 level. ?There is previous anterior fusion at C6-C7 level. Fluoroscopic time ?was 9 seconds. Estimated radiation dose is 3.27 mGy. ?  ?IMPRESSION: ?Fluoroscopic assistance was provided for cervical spinal fusion at ?C5-C6 level. ?  ?  ?Electronically Signed ?  By: Ernie Avena M.D. ?  On: 05/14/2021 10:51 ?  ?PATIENT SURVEYS:  ?FOTO 40 ?  ?  ?COGNITION: ?Overall cognitive status: Within functional limits for tasks assessed ?  ?  ?SENSATION: ?Light touch: Impaired numbness in tingling in posterior side of hand and along posterior side of elbow and forearm along radial nerve distribution.  ?  ?POSTURE:  ?Forward head and rounded shoulders  ?  ?            ?  ?CERVICAL ROM:  ?  ?Active ROM A/PROM (deg) ?06/07/2021  ?Flexion 30  ?Extension 30  ?Right  lateral flexion 30  ?Left lateral flexion 30  ?Right rotation 30  ?Left rotation 30  ? (Blank rows = not tested) ?  ?UE ROM: ?  ?AROM + PROM ROM Right ?06/07/2021 Left ?06/07/2021  ?Shoulder flexion      ?Shoulder extension 180 120  ?Shoulder abduction 180 120  ?Shoulder adduction      ?Shoulder extension 60 60  ?Shoulder internal rotation      ?Shoulder external rotation      ?Elbow flexion      ?Elbow extension      ?Wrist flexion 0 70  ?Wrist extension 0 80  ?Wrist ulnar deviation      ?Wrist radial  deviation      ?Wrist pronation      ?Wrist supination      ? (Blank rows = not tested) ?  ?UE MMT: ?  ?MMT Right ?06/07/2021 Left ?06/07/2021  ?Shoulder flexion 5/5 4/5  ?Shoulder extension      ?Shoulder a

## 2021-06-26 ENCOUNTER — Encounter: Payer: Self-pay | Admitting: Physical Therapy

## 2021-06-26 ENCOUNTER — Encounter: Payer: Self-pay | Admitting: Occupational Therapy

## 2021-06-26 ENCOUNTER — Ambulatory Visit: Payer: BC Managed Care – PPO | Admitting: Occupational Therapy

## 2021-06-26 DIAGNOSIS — M6281 Muscle weakness (generalized): Secondary | ICD-10-CM

## 2021-06-26 DIAGNOSIS — M25632 Stiffness of left wrist, not elsewhere classified: Secondary | ICD-10-CM

## 2021-06-26 DIAGNOSIS — M25642 Stiffness of left hand, not elsewhere classified: Secondary | ICD-10-CM

## 2021-06-26 NOTE — Therapy (Signed)
?OUTPATIENT OCCUPATIONAL THERAPY TREATMENT NOTE ? ? ?Patient Name: Lori Calhoun ?MRN: [id number] ?DOB:1970-05-12, 51 y.o., female ?Today's Date: 06/26/2021 ? ?PCP: Enid BaasKalisetti, Radhika, MD ?REFERRING PROVIDER: Venetia NightYarbrough, Chester, MD ? ?END OF SESSION:  ? OT End of Session - 06/26/21 1359   ? ? Visit Number 2   ? Number of Visits 8   ? Date for OT Re-Evaluation 09/10/21   ? OT Start Time 1359   ? Activity Tolerance Patient tolerated treatment well   ? [facility] assessed/performed   ? ?  ?  ? ?  ? ? ?Past Medical History:  ?Diagnosis Date  ? Diabetes mellitus, type 2 (HCC)   ? Hypertension   ? Peripheral neuropathy   ? feet  ? ?Past Surgical History:  ?Procedure Laterality Date  ? adnoids  Bilateral 1979  ? AMPUTATION TOE Right 04/15/2018  ? Procedure: RAY RIGHT 5TH;  Surgeon: Gwyneth RevelsFowler, Justin, DPM;  [facility];  Service: Podiatry;  Laterality: Right;  general with local ?Diabetic - oral meds  ? ANTERIOR CERVICAL DECOMP/DISCECTOMY FUSION N/A 05/14/2021  ? Procedure: C5-6 ANTERIOR CERVICAL DISCECTOMY AND FUSION (GLOBUS HEDRON);  Surgeon: Venetia NightYarbrough, Chester, MD;  Location: [facility];  Service: Neurosurgery;  Laterality: N/A;  ? CERVICAL SPINE SURGERY    ? plate at [id number]  ? LAPAROSCOPIC SUPRACERVICAL HYSTERECTOMY  2014  ? right wrist surgery  2022  ? ?Patient Active Problem List  ? Diagnosis Date Noted  ? Benzodiazepine overdose 11/04/2016  ? Adjustment disorder with mixed disturbance of emotions and conduct 11/04/2016  ? Marijuana abuse 11/04/2016  ? ? ?ONSET DATE: 05/14/21 ?  ?REFERRING DIAG: s/p Cervical fusion ?  ?THERAPY DIAG:  ?Muscle weakness (generalized) ?  ?Stiffness of left wrist, not elsewhere classified ?  ?Stiffness of left hand, not elsewhere classified ?  ?SUBJECTIVE:  ?  ?SUBJECTIVE STATEMENT: I have seen the surgeon earlier today he is not worried and said he does not think I need a nerve conduction test.  This can take about a year.  I feel better than you and  him set the same thing.  My tentative date to go back to work is first or 2 May.  I have done the exercises I do not know how much better I am you need to check ? ? ?Pt accompanied by: Alone ?  ?PERTINENT HISTORY: 05/03/21 Per Dr. Lucienne CapersYarbrough's Note  ?Assessment and Plan: ?Lori Calhoun is a pleasant 51 y.o. female with cervical myelopathy due to cervical stenosis at C5-6 from adjacent segment disease. She has worsening symptoms over the past 2 months. She has objective findings on exam including weakness of her left arm as well as hyperreflexia. She is unable to do tandem walk and has loss of balance. ? ?There is no role for conservative management and cervical myelopathy. I recommended surgical intervention with a C5-6 anterior cervical discectomy and fusion. We will send her for vocal cord clearance. ? ?I discussed the planned procedure at length with the patient, including the risks, benefits, alternatives, and indications. The risks discussed include but are not limited to bleeding, infection, need for reoperation, spinal fluid leak, stroke, vision loss, anesthetic complication, coma, paralysis, and even death. We also discussed the possibility of post-operative dysphagia, vocal cord paralysis, and the risk of adjacent segment disease in the future. I also described in detail that improvement was not guaranteed. ? ?The patient expressed understanding of these risks, and asked that we proceed with surgery. I described the surgery in  layman's terms, and gave ample opportunity for questions, which were answered to the best of my ability. ?PRECAUTIONS: Lifting precaution 5 pounds until 6 weeks postop ?  ?WEIGHT BEARING RESTRICTIONS no weightbearing through left hand ?PAIN:  ?Are you having pain?  No pain ?PATIENT GOALS I want to be able to Do My Job, cut my food, lift pots, do buttons and fasten my pants, brush my teeth, squeeze a washcloth, do my hair, left the cup or glass, open packages or jars, ?  ?OBJECTIVE:  ?  [facility]  OT Assessment - 06/18/21 0001   ?  ?    ?     ?  Assessment  ?  Medical Diagnosis S/p cervical fusion   ?  Referring Provider (OT) Myer Haff   ?  Onset Date/Surgical Date 05/14/21   ?  Hand Dominance Right   ?  Next MD Visit 11th April   ?     ?  Home  Environment  ?  Lives With Spouse   ?     ?  Prior Function  ?  Vocation Full time employment   ?  Leisure likes to read, some gardening , cooking- work as [employer]   ?     ?  AROM  ?  Overall AROM Comments decrease thumb RA, digits extention 3-/5 for 2nd and 3rd thru 5 unable to extend at [facility], Opposition 3+/5;   ?     ?  Strength  ?  Left Forearm Pronation 3-/5   ?  Left Forearm Supination 3+/5   ?  Left Wrist Flexion 3+/5   ?  Left Wrist Extension 3/5   ?  Left Wrist Radial Deviation 3+/5   ?  Left Wrist Ulnar Deviation 3-/5   ?  ?   ?  ?  ?   ?  ?  ?Denies any sensation changes ?  ?TODAY'S TREATMENT: I reviewed home program from last time reviewed home exercises from last time for all forearm, wrist and digits.   ? ?Three-quarter cuff weight around hand for supination pronation with elbow to side able to increase to 12 reps.Less compensation and stronger motion. Change  place and hold wrist extension, without weight against gravity and then 3/4 weight horizontal without gravity. Able to get about 50 degrees of extention 8-12 reps  ?Assess  on tissue on table ulnar /radial deviation 2 sets of 8 reps.  But UD decrease - change to AAROM palms together and then over armrest with hand in fist - with gravity 8 reps  ?Sliding thumb radial abduction on paper with 3/4 motion- and then rolling over thumb and palm from palm down to neutral while maintaining extention 8 reps - 12 reps ?Pt to  do light weightbearing through palm to stretch flexors of forearm and hand prior to any wrist extension and  especially digits extension home exercises.  ? Had better success after flexor stretches.  Attempt tapping of digits able to do second digit 12 reps and  placed on hold of fifth 5 reps. Rlolling digits over roller and maintaining all digits in extention off roller- 8 reps to 12 reps ?Can cont to use a wrist brace at night or during heavy activity to stabilize wrist. ?  opposition picking up 1 or 2 cm foam block alternating digits.  I have a hard time extending fourth digit during opposition to fifth can use 4th and 5th together - focus on extending digits if not picking up. Then  sticky golf ball opposition from palm to digits - able to do 30 sec - x 3 . . ?  ?  ?  ?PATIENT EDUCATION: ?Education details: progress, nerve healing and protocol for rehab, home exercises ?Person educated: Patient was educated  ?education method: Explanation, Demonstration, Tactile cues, Verbal cues, and Handouts ?Education comprehension: verbalized understanding, returned demonstration, verbal cues required, and needs further education ?  ?  ?  ?GOALS: ?Goals reviewed with patient? Yes ?  ?SHORT TERM GOALS: Target date: 07/16/2021 ?  ?Patient be independent to decrease flexor tone and show increase  wrist extension and digit extension ?Baseline: Wrist extension partial range has to do placed on hold, able to tap second digit unable to do second and third, fifth initiation of extension but unable to do place and hold ?Goal status: INITIAL ?LONG TERM GOALS: Target date: You ?  ?Left wrist and forearm strength increased to 4+/5 10 doorknob, hold drink an open jar without wrist collapsing ?Baseline: Decreased strength in pronation more than supination, wrist extension and ulnar deviation being 3-3 minus/5 strength-compensating with abduction of elbow external rotation ?Goal status: INITIAL ?  ?2.  Left digits extension increased for patient to release large cylinder up check. ?Baseline: Unable to extend second and third digits, trace of extension on fifth able to tap the second of table after light weightbearing through palm ?Goal status: INITIAL ?  ?3.  Left thumb radial abduction and  opposition increase for patient to be able to do rubber band for ponytail-if needed can simulate ?Baseline: Patient unable to to show extension of third and fourth digits, trace of fifth had to cut her hair unab

## 2021-06-27 ENCOUNTER — Ambulatory Visit: Payer: BC Managed Care – PPO | Admitting: Physical Therapy

## 2021-06-27 ENCOUNTER — Encounter: Payer: Self-pay | Admitting: Physical Therapy

## 2021-06-27 DIAGNOSIS — M6281 Muscle weakness (generalized): Secondary | ICD-10-CM | POA: Diagnosis not present

## 2021-06-27 DIAGNOSIS — M25612 Stiffness of left shoulder, not elsewhere classified: Secondary | ICD-10-CM

## 2021-06-27 NOTE — Therapy (Signed)
?OUTPATIENT PHYSICAL THERAPY TREATMENT NOTE ? ? ?Patient Name: Lori Calhoun ?MRN: [id number] ?DOB:30-Oct-1970, 51 y.o., female ?Today's Date: 06/27/2021 ? ?PCP: Gladstone Lighter, MD ?REFERRING PROVIDER: Meade Maw, MD ? ? PT End of Session - 06/27/21 1145   ? ? Visit Number 4   ? Number of Visits 16   ? Date for PT Re-Evaluation 08/02/21   ? Authorization Type [id number]   ? PT Start Time 1145   ? PT Stop Time 1230   ? PT Time Calculation (min) 45 min   ? Activity Tolerance Patient tolerated treatment well   ? Behavior During Therapy [facility] for tasks assessed/performed   ? ?  ?  ? ?  ? ? ? ?Past Medical History:  ?Diagnosis Date  ? Diabetes mellitus, type 2 (Lori Calhoun)   ? Hypertension   ? Peripheral neuropathy   ? feet  ? ?Past Surgical History:  ?Procedure Laterality Date  ? adnoids  Bilateral 1979  ? AMPUTATION TOE Right 04/15/2018  ? Procedure: RAY RIGHT 5TH;  Surgeon: Samara Deist, DPM;  Location: [place];  Service: Podiatry;  Laterality: Right;  general with local ?Diabetic - oral meds  ? ANTERIOR CERVICAL DECOMP/DISCECTOMY FUSION N/A 05/14/2021  ? Procedure: C5-6 ANTERIOR CERVICAL DISCECTOMY AND FUSION (GLOBUS HEDRON);  Surgeon: Lori Maw, MD;  Location: [facility];  Service: Neurosurgery;  Laterality: N/A;  ? CERVICAL SPINE SURGERY    ? plate at [id number]  ? LAPAROSCOPIC SUPRACERVICAL HYSTERECTOMY  2014  ? right wrist surgery  2022  ? ?Patient Active Problem List  ? Diagnosis Date Noted  ? Benzodiazepine overdose 11/04/2016  ? Adjustment disorder with mixed disturbance of emotions and conduct 11/04/2016  ? Marijuana abuse 11/04/2016  ? ? ?REFERRING DIAG: Weakness of Left  elbow extensor and Left Shoulder Stiffness  ? ?THERAPY DIAG:  ?Stiffness of left shoulder, not elsewhere classified ? ?Muscle weakness (generalized) ? ?PERTINENT HISTORY: 05/03/21 Per Dr. Rhea Bleacher Note  ?Assessment and Plan: ?[name] is a pleasant 51 y.o. female with cervical myelopathy due to cervical stenosis at C5-6  from adjacent segment disease. She has worsening symptoms over the past 2 months. She has objective findings on exam including weakness of her left arm as well as hyperreflexia. She is unable to do tandem walk and has loss of balance. ? ?There is no role for conservative management and cervical myelopathy. I recommended surgical intervention with a C5-6 anterior cervical discectomy and fusion. We will send her for vocal cord clearance. ? ?I discussed the planned procedure at length with the patient, including the risks, benefits, alternatives, and indications. The risks discussed include but are not limited to bleeding, infection, need for reoperation, spinal fluid leak, stroke, vision loss, anesthetic complication, coma, paralysis, and even death. We also discussed the possibility of post-operative dysphagia, vocal cord paralysis, and the risk of adjacent segment disease in the future. I also described in detail that improvement was not guaranteed. ? ?The patient expressed understanding of these risks, and asked that we proceed with surgery. I described the surgery in layman's terms, and gave ample opportunity for questions, which were answered to the best of my ability. ? ?PRECAUTIONS: Do not lift >10 lbs  ? ?SUBJECTIVE: She reports that she just met with neurosurgeon who believes she is progressing correctly. She continues to perform exercises and she is feeling some residual soreness. She describes that shoulder is loosing up since the start of PT.  ? ?PAIN:  ?Are you having pain? No, but when she reaches  overhead she experiences a 12/10 for pain.  ? ? ?OBJECTIVE:  ?  ?VITALS: BP 109/74 HR 84 SpO2 100 ?  ?DIAGNOSTIC FINDINGS:  ?CLINICAL DATA:  Cervicalgia M54.2 (ICD-10-CM) ?  ?EXAM: ?MRI CERVICAL SPINE WITHOUT CONTRAST ?  ?TECHNIQUE: ?Multiplanar, multisequence MR imaging of the cervical spine was ?performed. No intravenous contrast was administered. ?  ?COMPARISON:  MRI of the cervical spine 02/29/2020. ?   ?FINDINGS: ?Alignment: Similar alignment. Similar straightening with trace ?retrolisthesis of C7 on T1. Similar trace anterolisthesis of C4 on ?C5. ?  ?Vertebrae: C6-C7 ACDF with solid bony fusion. No focal marrow edema ?chest acute fracture, discitis/osteomyelitis, or suspicious bone ?lesion. ?  ?Cord: Normal cord signal. ?  ?Posterior Fossa, vertebral arteries, paraspinal tissues: Visualized ?vertebral artery flow voids are maintained. Unremarkable paraspinal ?soft tissues without evidence of edema. No evidence of acute ?abnormality in the visualized posterior fossa on limited assessment. ?  ?Disc levels: ?  ?C2-C3: Mild uncovertebral hypertrophy with bilateral facet ?arthropathy. No significant stenosis. ?  ?C3-C4: Posterior disc osteophyte complex, eccentric to the left with ?left greater than right facet and uncovertebral hypertrophy. ?Resulting similar versus mildly progressed moderate left foraminal ?stenosis with potential left C4 nerve impingement. Similar mild ?right foraminal stenosis and mild canal stenosis. ?  ?C4-C5: Posterior disc osteophyte complex with small central disc ?protrusion which contacts and flattens the ventral cord. Central ?disc protrusion is mildly progressed with overall similar mild canal ?stenosis. Similar mild left foraminal stenosis with patent right ?foramina. ?  ?C5-C6: Left eccentric posterior disc osteophyte complex with left ?greater than right facet and uncovertebral hypertrophy. Similar ?resulting moderate to severe spinal canal stenosis with flattening ?of the cord. Similar severe left greater than right foraminal ?stenosis. ?  ?C6-C7: Prior ACDF with patent canal. At least mild bilateral ?foraminal stenosis due to spurring, similar. ?  ?C7-T1: Left eccentric posterior disc osteophyte complex with left ?greater than right facet and uncovertebral hypertrophy. Similar ?severe left and mild-to-moderate right foraminal stenosis with ?potential impingement of the exiting left  C8 nerve. Similar mild ?canal stenosis. ?  ?IMPRESSION: ?1. At C5-C6, similar moderate to severe canal stenosis and severe ?bilateral neural foraminal stenosis. ?2. At C7-T1, similar severe left foraminal stenosis. ?3. At C3-C4, similar versus mildly progressed moderate left ?foraminal stenosis. ?4. Additional milder multilevel degenerative change is detailed ?above. ?  ?  ?Electronically Signed ?  By: Margaretha Sheffield M.D. ?  On: 05/02/2021 09:53 ?  ?CLINICAL DATA:  Fluoroscopic assistance for cervical spine fusion ?  ?EXAM: ?CERVICAL SPINE - 2-3 VIEW ?  ?COMPARISON:  None. ?  ?FINDINGS: ?Fluoroscopic images show interval anterior fusion at C5-C6 level. ?There is previous anterior fusion at C6-C7 level. Fluoroscopic time ?was 9 seconds. Estimated radiation dose is 3.27 mGy. ?  ?IMPRESSION: ?Fluoroscopic assistance was provided for cervical spinal fusion at ?C5-C6 level. ?  ?  ?Electronically Signed ?  By: Elmer Picker M.D. ?  On: 05/14/2021 10:51 ?  ?PATIENT SURVEYS:  ?FOTO 40 ?  ?  ?COGNITION: ?Overall cognitive status: Within functional limits for tasks assessed ?  ?  ?SENSATION: ?Light touch: Impaired numbness in tingling in posterior side of hand and along posterior side of elbow and forearm along radial nerve distribution.  ?  ?POSTURE:  ?Forward head and rounded shoulders  ?  ?            ?  ?CERVICAL ROM:  ?  ?Active ROM A/PROM (deg) ?06/07/2021  ?Flexion 30  ?Extension 30  ?Right lateral flexion 30  ?  Left lateral flexion 30  ?Right rotation 30  ?Left rotation 30  ? (Blank rows = not tested) ?  ?UE ROM: ?  ?AROM + PROM ROM Right ?06/07/2021 Left ?06/07/2021  ?Shoulder flexion      ?Shoulder extension 180 120  ?Shoulder abduction 180 120  ?Shoulder adduction      ?Shoulder extension 60 60  ?Shoulder internal rotation      ?Shoulder external rotation      ?Elbow flexion      ?Elbow extension      ?Wrist flexion 0 70  ?Wrist extension 0 80  ?Wrist ulnar deviation      ?Wrist radial deviation      ?Wrist  pronation      ?Wrist supination      ? (Blank rows = not tested) ?  ?UE MMT: ?  ?MMT Right ?06/07/2021 Left ?06/07/2021  ?Shoulder flexion 5/5 4/5  ?Shoulder extension      ?Shoulder abduction 5/5 5/5  ?Shoulde

## 2021-07-02 ENCOUNTER — Ambulatory Visit: Payer: BC Managed Care – PPO | Admitting: Physical Therapy

## 2021-07-04 ENCOUNTER — Encounter: Payer: Self-pay | Admitting: Physical Therapy

## 2021-07-04 ENCOUNTER — Ambulatory Visit: Payer: BC Managed Care – PPO | Admitting: Physical Therapy

## 2021-07-04 DIAGNOSIS — M6281 Muscle weakness (generalized): Secondary | ICD-10-CM

## 2021-07-04 DIAGNOSIS — M25612 Stiffness of left shoulder, not elsewhere classified: Secondary | ICD-10-CM

## 2021-07-04 NOTE — Therapy (Addendum)
?OUTPATIENT PHYSICAL THERAPY TREATMENT NOTE ? ? ?Patient Name: Lori Calhoun ?MRN: [id number] ?DOB:03-03-1971, 51 y.o., female ?Today's Date: 07/04/2021 ? ?PCP: Gladstone Lighter, MD ?REFERRING PROVIDER: Meade Maw, MD ? ? PT End of Session - 07/04/21 1723   ? ? Visit Number 5   ? Number of Visits 16   ? Date for PT Re-Evaluation 08/02/21   ? Authorization Type BCBS [id number]   ? PT Start Time [id number]   ? PT Stop Time 1800   ? PT Time Calculation (min) 40 min   ? Activity Tolerance Patient tolerated treatment well   ? Behavior During Therapy [facility] for tasks assessed/performed   ? ?  ?  ? ?  ? ? ? ?Past Medical History:  ?Diagnosis Date  ? Diabetes mellitus, type 2 ([place])   ? Hypertension   ? Peripheral neuropathy   ? feet  ? ?Past Surgical History:  ?Procedure Laterality Date  ? adnoids  Bilateral 1979  ? AMPUTATION TOE Right 04/15/2018  ? Procedure: RAY RIGHT 5TH;  Surgeon: Samara Deist, DPM;  Location: [facility];  Service: Podiatry;  Laterality: Right;  general with local ?Diabetic - oral meds  ? ANTERIOR CERVICAL DECOMP/DISCECTOMY FUSION N/A 05/14/2021  ? Procedure: C5-6 ANTERIOR CERVICAL DISCECTOMY AND FUSION (GLOBUS HEDRON);  Surgeon: Meade Maw, MD;  Location: [facility];  Service: Neurosurgery;  Laterality: N/A;  ? CERVICAL SPINE SURGERY    ? plate at [id number]  ? LAPAROSCOPIC SUPRACERVICAL HYSTERECTOMY  2014  ? right wrist surgery  2022  ? ?Patient Active Problem List  ? Diagnosis Date Noted  ? Benzodiazepine overdose 11/04/2016  ? Adjustment disorder with mixed disturbance of emotions and conduct 11/04/2016  ? Marijuana abuse 11/04/2016  ? ? ?REFERRING DIAG: Weakness of Left  elbow extensor and Left Shoulder Stiffness  ? ?THERAPY DIAG:  ?Stiffness of left shoulder, not elsewhere classified ? ?Muscle weakness (generalized) ? ?PERTINENT HISTORY: 05/03/21 Per Dr. Rhea Bleacher Note  ?Assessment and Plan: ?Lori Calhoun is a pleasant 51 y.o. female with cervical myelopathy due to cervical stenosis at C5-6  from adjacent segment disease. She has worsening symptoms over the past 2 months. She has objective findings on exam including weakness of her left arm as well as hyperreflexia. She is unable to do tandem walk and has loss of balance. ? ?There is no role for conservative management and cervical myelopathy. I recommended surgical intervention with a C5-6 anterior cervical discectomy and fusion. We will send her for vocal cord clearance. ? ?I discussed the planned procedure at length with the patient, including the risks, benefits, alternatives, and indications. The risks discussed include but are not limited to bleeding, infection, need for reoperation, spinal fluid leak, stroke, vision loss, anesthetic complication, coma, paralysis, and even death. We also discussed the possibility of post-operative dysphagia, vocal cord paralysis, and the risk of adjacent segment disease in the future. I also described in detail that improvement was not guaranteed. ? ?The patient expressed understanding of these risks, and asked that we proceed with surgery. I described the surgery in layman's terms, and gave ample opportunity for questions, which were answered to the best of my ability. ? ?PRECAUTIONS: Do not lift >10 lbs  ? ?SUBJECTIVE: Pt has returned to work, because her FMLA has run out. She has been able to adjust to demands of work, but it has been difficult. She reports that her co-workers have been helping her with tasks at work.  ? ?PAIN:  ?Are you having pain? NPS 6/10 on cervical  vertebrae and runs across her back  ? ? ?OBJECTIVE:  ?  ?VITALS: BP 109/74 HR 84 SpO2 100 ?  ?DIAGNOSTIC FINDINGS:  ?CLINICAL DATA:  Cervicalgia M54.2 (ICD-10-CM) ?  ?EXAM: ?MRI CERVICAL SPINE WITHOUT CONTRAST ?  ?TECHNIQUE: ?Multiplanar, multisequence MR imaging of the cervical spine was ?performed. No intravenous contrast was administered. ?  ?COMPARISON:  MRI of the cervical spine 02/29/2020. ?  ?FINDINGS: ?Alignment: Similar alignment.  Similar straightening with trace ?retrolisthesis of C7 on T1. Similar trace anterolisthesis of C4 on ?C5. ?  ?Vertebrae: C6-C7 ACDF with solid bony fusion. No focal marrow edema ?chest acute fracture, discitis/osteomyelitis, or suspicious bone ?lesion. ?  ?Cord: Normal cord signal. ?  ?Posterior Fossa, vertebral arteries, paraspinal tissues: Visualized ?vertebral artery flow voids are maintained. Unremarkable paraspinal ?soft tissues without evidence of edema. No evidence of acute ?abnormality in the visualized posterior fossa on limited assessment. ?  ?Disc levels: ?  ?C2-C3: Mild uncovertebral hypertrophy with bilateral facet ?arthropathy. No significant stenosis. ?  ?C3-C4: Posterior disc osteophyte complex, eccentric to the left with ?left greater than right facet and uncovertebral hypertrophy. ?Resulting similar versus mildly progressed moderate left foraminal ?stenosis with potential left C4 nerve impingement. Similar mild ?right foraminal stenosis and mild canal stenosis. ?  ?C4-C5: Posterior disc osteophyte complex with small central disc ?protrusion which contacts and flattens the ventral cord. Central ?disc protrusion is mildly progressed with overall similar mild canal ?stenosis. Similar mild left foraminal stenosis with patent right ?foramina. ?  ?C5-C6: Left eccentric posterior disc osteophyte complex with left ?greater than right facet and uncovertebral hypertrophy. Similar ?resulting moderate to severe spinal canal stenosis with flattening ?of the cord. Similar severe left greater than right foraminal ?stenosis. ?  ?C6-C7: Prior ACDF with patent canal. At least mild bilateral ?foraminal stenosis due to spurring, similar. ?  ?C7-T1: Left eccentric posterior disc osteophyte complex with left ?greater than right facet and uncovertebral hypertrophy. Similar ?severe left and mild-to-moderate right foraminal stenosis with ?potential impingement of the exiting left C8 nerve. Similar mild ?canal stenosis. ?   ?IMPRESSION: ?1. At C5-C6, similar moderate to severe canal stenosis and severe ?bilateral neural foraminal stenosis. ?2. At C7-T1, similar severe left foraminal stenosis. ?3. At C3-C4, similar versus mildly progressed moderate left ?foraminal stenosis. ?4. Additional milder multilevel degenerative change is detailed ?above. ?  ?  ?Electronically Signed ?  By: Margaretha Sheffield M.D. ?  On: 05/02/2021 09:53 ?  ?CLINICAL DATA:  Fluoroscopic assistance for cervical spine fusion ?  ?EXAM: ?CERVICAL SPINE - 2-3 VIEW ?  ?COMPARISON:  None. ?  ?FINDINGS: ?Fluoroscopic images show interval anterior fusion at C5-C6 level. ?There is previous anterior fusion at C6-C7 level. Fluoroscopic time ?was 9 seconds. Estimated radiation dose is 3.27 mGy. ?  ?IMPRESSION: ?Fluoroscopic assistance was provided for cervical spinal fusion at ?C5-C6 level. ?  ?  ?Electronically Signed ?  By: Elmer Picker M.D. ?  On: 05/14/2021 10:51 ?  ?PATIENT SURVEYS:  ?FOTO 40 ?  ?  ?COGNITION: ?Overall cognitive status: Within functional limits for tasks assessed ?  ?  ?SENSATION: ?Light touch: Impaired numbness in tingling in posterior side of hand and along posterior side of elbow and forearm along radial nerve distribution.  ?  ?POSTURE:  ?Forward head and rounded shoulders  ?  ?            ?  ?CERVICAL ROM:  ?  ?Active ROM A/PROM (deg) ?06/07/2021  ?Flexion 30  ?Extension 30  ?Right lateral flexion 30  ?  Left lateral flexion 30  ?Right rotation 30  ?Left rotation 30  ? (Blank rows = not tested) ?  ?UE ROM: ?  ?AROM + PROM ROM Right ?06/07/2021 Left ?06/07/2021  ?Shoulder flexion      ?Shoulder extension 180 120  ?Shoulder abduction 180 120  ?Shoulder adduction      ?Shoulder extension 60 60  ?Shoulder internal rotation      ?Shoulder external rotation      ?Elbow flexion      ?Elbow extension      ?Wrist flexion 0 70  ?Wrist extension 0 80  ?Wrist ulnar deviation      ?Wrist radial deviation      ?Wrist pronation      ?Wrist supination      ?  (Blank rows = not tested) ?  ?UE MMT: ?  ?MMT Right ?06/07/2021 Left ?06/07/2021  ?Shoulder flexion 5/5 4/5  ?Shoulder extension      ?Shoulder abduction 5/5 5/5  ?Shoulder adduction      ?Shoulder extension

## 2021-07-04 NOTE — Therapy (Incomplete Revision)
?OUTPATIENT PHYSICAL THERAPY TREATMENT NOTE ? ? ?Patient Name: Lori Calhoun ?MRN: [id number] ?DOB:07/09/1970, 50 y.o., female ?Today's Date: 07/04/2021 ? ?PCP: Kalisetti, Radhika, MD ?REFERRING PROVIDER: Yarbrough, Chester, MD ? ? PT End of Session - 07/04/21 1723   ? ? Visit Number 5   ? Number of Visits 16   ? Date for PT Re-Evaluation 08/02/21   ? Authorization Type BCBS [id number]   ? PT Start Time 1720   ? PT Stop Time 1800   ? PT Time Calculation (min) 40 min   ? Activity Tolerance Patient tolerated treatment well   ? Behavior During Therapy [facility] for tasks assessed/performed   ? ?  ?  ? ?  ? ? ? ?Past Medical History:  ?Diagnosis Date  ? Diabetes mellitus, type 2 ([facility])   ? Hypertension   ? Peripheral neuropathy   ? feet  ? ?Past Surgical History:  ?Procedure Laterality Date  ? adnoids  Bilateral 1979  ? AMPUTATION TOE Right 04/15/2018  ? Procedure: RAY RIGHT 5TH;  Surgeon: Fowler, Justin, DPM;  Location: [facility];  Service: Podiatry;  Laterality: Right;  general with local ?Diabetic - oral meds  ? ANTERIOR CERVICAL DECOMP/DISCECTOMY FUSION N/A 05/14/2021  ? Procedure: C5-6 ANTERIOR CERVICAL DISCECTOMY AND FUSION (GLOBUS HEDRON);  Surgeon: Yarbrough, Chester, MD;  Location: [facility];  Service: Neurosurgery;  Laterality: N/A;  ? CERVICAL SPINE SURGERY    ? plate at C6-C7  ? LAPAROSCOPIC SUPRACERVICAL HYSTERECTOMY  2014  ? right wrist surgery  2022  ? ?Patient Active Problem List  ? Diagnosis Date Noted  ? Benzodiazepine overdose 11/04/2016  ? Adjustment disorder with mixed disturbance of emotions and conduct 11/04/2016  ? Marijuana abuse 11/04/2016  ? ? ?REFERRING DIAG: Weakness of Left  elbow extensor and Left Shoulder Stiffness  ? ?THERAPY DIAG:  ?Stiffness of left shoulder, not elsewhere classified ? ?Muscle weakness (generalized) ? ?PERTINENT HISTORY: 05/03/21 Per Dr. Yarbrough's Note  ?Assessment and Plan: ?Ms. Schaaf is a pleasant 50 y.o. female with cervical myelopathy due to cervical stenosis at C5-6  from adjacent segment disease. She has worsening symptoms over the past 2 months. She has objective findings on exam including weakness of her left arm as well as hyperreflexia. She is unable to do tandem walk and has loss of balance. ? ?There is no role for conservative management and cervical myelopathy. I recommended surgical intervention with a C5-6 anterior cervical discectomy and fusion. We will send her for vocal cord clearance. ? ?I discussed the planned procedure at length with the patient, including the risks, benefits, alternatives, and indications. The risks discussed include but are not limited to bleeding, infection, need for reoperation, spinal fluid leak, stroke, vision loss, anesthetic complication, coma, paralysis, and even death. We also discussed the possibility of post-operative dysphagia, vocal cord paralysis, and the risk of adjacent segment disease in the future. I also described in detail that improvement was not guaranteed. ? ?The patient expressed understanding of these risks, and asked that we proceed with surgery. I described the surgery in layman's terms, and gave ample opportunity for questions, which were answered to the best of my ability. ? ?PRECAUTIONS: Do not lift >10 lbs  ? ?SUBJECTIVE: Pt has returned to work, because her FMLA has run out. She has been able to adjust to demands of work, but it has been difficult. She reports that her co-workers have been helping her with tasks at work.  ? ?PAIN:  ?Are you having pain? NPS 6/10 on cervical   vertebrae and runs across her back  ? ? ?OBJECTIVE:  ?  ?VITALS: BP 109/74 HR 84 SpO2 100 ?  ?DIAGNOSTIC FINDINGS:  ?CLINICAL DATA:  Cervicalgia M54.2 (ICD-10-CM) ?  ?EXAM: ?MRI CERVICAL SPINE WITHOUT CONTRAST ?  ?TECHNIQUE: ?Multiplanar, multisequence MR imaging of the cervical spine was ?performed. No intravenous contrast was administered. ?  ?COMPARISON:  MRI of the cervical spine 02/29/2020. ?  ?FINDINGS: ?Alignment: Similar alignment.  Similar straightening with trace ?retrolisthesis of C7 on T1. Similar trace anterolisthesis of C4 on ?C5. ?  ?Vertebrae: C6-C7 ACDF with solid bony fusion. No focal marrow edema ?chest acute fracture, discitis/osteomyelitis, or suspicious bone ?lesion. ?  ?Cord: Normal cord signal. ?  ?Posterior Fossa, vertebral arteries, paraspinal tissues: Visualized ?vertebral artery flow voids are maintained. Unremarkable paraspinal ?soft tissues without evidence of edema. No evidence of acute ?abnormality in the visualized posterior fossa on limited assessment. ?  ?Disc levels: ?  ?C2-C3: Mild uncovertebral hypertrophy with bilateral facet ?arthropathy. No significant stenosis. ?  ?C3-C4: Posterior disc osteophyte complex, eccentric to the left with ?left greater than right facet and uncovertebral hypertrophy. ?Resulting similar versus mildly progressed moderate left foraminal ?stenosis with potential left C4 nerve impingement. Similar mild ?right foraminal stenosis and mild canal stenosis. ?  ?C4-C5: Posterior disc osteophyte complex with small central disc ?protrusion which contacts and flattens the ventral cord. Central ?disc protrusion is mildly progressed with overall similar mild canal ?stenosis. Similar mild left foraminal stenosis with patent right ?foramina. ?  ?C5-C6: Left eccentric posterior disc osteophyte complex with left ?greater than right facet and uncovertebral hypertrophy. Similar ?resulting moderate to severe spinal canal stenosis with flattening ?of the cord. Similar severe left greater than right foraminal ?stenosis. ?  ?C6-C7: Prior ACDF with patent canal. At least mild bilateral ?foraminal stenosis due to spurring, similar. ?  ?C7-T1: Left eccentric posterior disc osteophyte complex with left ?greater than right facet and uncovertebral hypertrophy. Similar ?severe left and mild-to-moderate right foraminal stenosis with ?potential impingement of the exiting left C8 nerve. Similar mild ?canal stenosis. ?   ?IMPRESSION: ?1. At C5-C6, similar moderate to severe canal stenosis and severe ?bilateral neural foraminal stenosis. ?2. At C7-T1, similar severe left foraminal stenosis. ?3. At C3-C4, similar versus mildly progressed moderate left ?foraminal stenosis. ?4. Additional milder multilevel degenerative change is detailed ?above. ?  ?  ?Electronically Signed ?  By: Frederick S Jones M.D. ?  On: 05/02/2021 09:53 ?  ?CLINICAL DATA:  Fluoroscopic assistance for cervical spine fusion ?  ?EXAM: ?CERVICAL SPINE - 2-3 VIEW ?  ?COMPARISON:  None. ?  ?FINDINGS: ?Fluoroscopic images show interval anterior fusion at C5-C6 level. ?There is previous anterior fusion at C6-C7 level. Fluoroscopic time ?was 9 seconds. Estimated radiation dose is 3.27 mGy. ?  ?IMPRESSION: ?Fluoroscopic assistance was provided for cervical spinal fusion at ?C5-C6 level. ?  ?  ?Electronically Signed ?  By: Palani  Rathinasamy M.D. ?  On: 05/14/2021 10:51 ?  ?PATIENT SURVEYS:  ?FOTO 40 ?  ?  ?COGNITION: ?Overall cognitive status: Within functional limits for tasks assessed ?  ?  ?SENSATION: ?Light touch: Impaired numbness in tingling in posterior side of hand and along posterior side of elbow and forearm along radial nerve distribution.  ?  ?POSTURE:  ?Forward head and rounded shoulders  ?  ?            ?  ?CERVICAL ROM:  ?  ?Active ROM A/PROM (deg) ?06/07/2021  ?Flexion 30  ?Extension 30  ?Right lateral flexion 30  ?  Left lateral flexion 30  ?Right rotation 30  ?Left rotation 30  ? (Blank rows = not tested) ?  ?UE ROM: ?  ?AROM + PROM ROM Right ?06/07/2021 Left ?06/07/2021  ?Shoulder flexion      ?Shoulder extension 180 120  ?Shoulder abduction 180 120  ?Shoulder adduction      ?Shoulder extension 60 60  ?Shoulder internal rotation      ?Shoulder external rotation      ?Elbow flexion      ?Elbow extension      ?Wrist flexion 0 70  ?Wrist extension 0 80  ?Wrist ulnar deviation      ?Wrist radial deviation      ?Wrist pronation      ?Wrist supination      ?  (Blank rows = not tested) ?  ?UE MMT: ?  ?MMT Right ?06/07/2021 Left ?06/07/2021  ?Shoulder flexion 5/5 4/5  ?Shoulder extension      ?Shoulder abduction 5/5 5/5  ?Shoulder adduction      ?Shoulder extension

## 2021-07-05 ENCOUNTER — Ambulatory Visit: Payer: BC Managed Care – PPO | Admitting: Occupational Therapy

## 2021-07-05 ENCOUNTER — Encounter: Payer: BC Managed Care – PPO | Admitting: Physical Therapy

## 2021-07-05 ENCOUNTER — Encounter: Payer: BC Managed Care – PPO | Admitting: Occupational Therapy

## 2021-07-09 ENCOUNTER — Encounter: Payer: BC Managed Care – PPO | Admitting: Physical Therapy

## 2021-07-09 ENCOUNTER — Encounter: Payer: BC Managed Care – PPO | Admitting: Occupational Therapy

## 2021-07-09 ENCOUNTER — Ambulatory Visit: Payer: BC Managed Care – PPO | Admitting: Occupational Therapy

## 2021-07-09 DIAGNOSIS — M6281 Muscle weakness (generalized): Secondary | ICD-10-CM

## 2021-07-09 DIAGNOSIS — M25632 Stiffness of left wrist, not elsewhere classified: Secondary | ICD-10-CM

## 2021-07-09 DIAGNOSIS — M25642 Stiffness of left hand, not elsewhere classified: Secondary | ICD-10-CM

## 2021-07-09 NOTE — Therapy (Signed)
[place] ?[facility] ?[address] ?[place], [place], [address] ?Phone: [phone]   Fax:  [phone] ? ?Occupational Therapy Treatment ? ?Patient Details  ?Name: Lori Calhoun ?MRN: [id number] ?Date of Birth: Jan 03, 1971 ?Referring Provider (OT): Myer Haff ? ? ?Encounter Date: 07/09/2021 ? ? OT End of Session - 07/09/21 1739   ? ? Visit Number 3   ? Number of Visits 8   ? Date for OT Re-Evaluation 09/10/21   ? OT Start Time 1700   ? OT Stop Time 1740   ? OT Time Calculation (min) 40 min   ? Activity Tolerance Patient tolerated treatment well   ? Behavior During Therapy [facility] for tasks assessed/performed   ? ?  ?  ? ?  ? ? ?Past Medical History:  ?Diagnosis Date  ? Diabetes mellitus, type 2 (HCC)   ? Hypertension   ? Peripheral neuropathy   ? feet  ? ? ?Past Surgical History:  ?Procedure Laterality Date  ? adnoids  Bilateral 1979  ? AMPUTATION TOE Right 04/15/2018  ? Procedure: RAY RIGHT 5TH;  Surgeon: Gwyneth Revels, DPM;  Location: [facility];  Service: Podiatry;  Laterality: Right;  general with local ?Diabetic - oral meds  ? ANTERIOR CERVICAL DECOMP/DISCECTOMY FUSION N/A 05/14/2021  ? Procedure: C5-6 ANTERIOR CERVICAL DISCECTOMY AND FUSION (GLOBUS HEDRON);  Surgeon: Venetia Night, MD;  Location: [facility];  Service: Neurosurgery;  Laterality: N/A;  ? CERVICAL SPINE SURGERY    ? plate at [id number]  ? LAPAROSCOPIC SUPRACERVICAL HYSTERECTOMY  2014  ? right wrist surgery  2022  ? ? ?There were no vitals filed for this visit. ? ? Subjective Assessment - 07/09/21 1738   ? ? Subjective  Is a little frustrating at work because I can probably do about 50% of my job.  I need both hands to pull or push or stabilize with.  I do think my grip is maybe a little bit stronger just because of using it.   ? Pertinent History 05/03/21 Per Dr. Lucienne Capers Note   Assessment and Plan:  Ms. Olvera is a pleasant 51 y.o. female with cervical myelopathy due to cervical stenosis  at C5-6 from adjacent segment disease. She has worsening symptoms over the past 2 months. She has objective findings on exam including weakness of her left arm as well as hyperreflexia. She is unable to do tandem walk and has loss of balance.    There is no role for conservative management and cervical myelopathy. I recommended surgical intervention with a C5-6 anterior cervical discectomy and fusion. We will send her for vocal cord clearance.    I discussed the planned procedure at length with the patient, including the risks, benefits, alternatives, and indications. The risks discussed include but are not limited to bleeding, infection, need for reoperation, spinal fluid leak, stroke, vision loss, anesthetic complication, coma, paralysis, and even death. We also discussed the possibility of post-operative dysphagia, vocal cord paralysis, and the risk of adjacent segment disease in the future. I also described in detail that improvement was not guaranteed.    The patient expressed understanding of these risks, and asked that we proceed with surgery. I described the surgery in layman's terms, and gave ample opportunity   ? Currently in Pain? No/denies   ? ?  ?  ? ?  ? ? ? ? ? [facility] OT Assessment - 07/09/21 0001   ? ?  ? Strength  ? Right Hand Grip (lbs) 41   ?  Right Hand Lateral Pinch 15 lbs   ? Right Hand 3 Point Pinch 11 lbs   ? Left Hand Grip (lbs) 18   ? Left Hand Lateral Pinch 3 lbs   ? Left Hand 3 Point Pinch 5 lbs   ? ?  ?  ? ?  ? ? ? ? ?TODAY'S TREATMENT: I reviewed home program from last time reviewed home exercises from last time for all forearm, wrist and digits and upgrade no changes needed. ?Assess grip and prehension today see flowsheet patient collapses at wrist in flexion during grip and three-point pinch.   ?  ?Three-quarter cuff weight around hand for supination pronation with elbow to side able to increase to 2 times 12 reps.Less compensation and stronger motion.  ?Change  place and hold wrist  extension to 3/4 weight against gravity-able to get about 50 degrees of extention 8-12 reps can do 2 sets.  Weightbearing in between ?ulnar /radial deviation 2 sets of 8 reps with three-quarter weight over armrest maintaining midline 2 sets of 8 can do weightbearing in between if needed  ? ?Sliding thumb radial abduction on paper with 3/4 motion- and then rolling over thumb and palm from palm down to neutral while maintaining extention 8 reps - 12 reps-continue same home program ?Pt to  do light weightbearing through palm to stretch flexors of forearm and hand prior to any wrist extension and  especially digits extension home exercises.  ? Had better success after flexor stretches.  Attempt tapping of digits able to do second digit 12 reps and placed on hold of fifth 5 reps. Rlolling digits over roller and maintaining all digits in extention off roller- 8 reps to 12 reps ?Can cont to use a wrist brace at night or during heavy activity to stabilize wrist. ?Did fit patient with a Benik neoprene to use during home exercises to cue wrist to stay neutral without being dependent on the hard brace or no brace. ?After review patient with light blue for gripping but stopped before wrist collapses and flexion 2 sets of 8, and using blue neoprene wrist splint for lateral pinch and three-point pinch 2 sets of 8. ?  opposition picking up 1 or 2 cm foam block alternating digits.  I have a hard time extending fourth digit during opposition to fifth can use 4th and 5th together - focus on extending digits if not picking up. Then sticky golf ball opposition from palm to digits - able to do 30 sec - x 3 . . ?  ?  ?  ?PATIENT EDUCATION: ?Education details: progress, nerve healing and protocol for rehab, home exercises ?Person educated: Patient was educated  ?education method: Explanation, Demonstration, Tactile cues, Verbal cues, and Handouts ?Education comprehension: verbalized understanding, returned demonstration, verbal cues  required, and needs further education ?  ?  ?  ?GOALS: ?Goals reviewed with patient? Yes ?  ?SHORT TERM GOALS: Target date: 07/16/2021 ?  ?Patient be independent to decrease flexor tone and show increase  wrist extension and digit extension ?Baseline: Wrist extension partial range has to do placed on hold, able to tap second digit unable to do second and third, fifth initiation of extension but unable to do place and hold ?Goal status: INITIAL ?LONG TERM GOALS: Target date: You ?  ?Left wrist and forearm strength increased to 4+/5 10 doorknob, hold drink an open jar without wrist collapsing ?Baseline: Decreased strength in pronation more than supination, wrist extension and ulnar deviation being 3-3 minus/5 strength-compensating with abduction  of elbow external rotation ?Goal status: INITIAL ?  ?2.  Left digits extension increased for patient to release large cylinder up check. ?Baseline: Unable to extend second and third digits, trace of extension on fifth able to tap the second of table after light weightbearing through palm ?Goal status: INITIAL ?  ?3.  Left thumb radial abduction and opposition increase for patient to be able to do rubber band for ponytail-if needed can simulate ?Baseline: Patient unable to to show extension of third and fourth digits, trace of fifth had to cut her hair unable to do ponytail because of decreased extension of digits and thumb radial abduction ?Goal status: INITIAL ?  ?4.  Left wrist extension increase for patient to squeeze toothpaste squeeze washcloth and do buttons without collapsing into flexion ?Baseline: Patient unable to stabilize neutral wrist during any gripping or pinches for ADLs ?Goal status: INITIAL ?  ?5.  Assist grip and prehension strength when patient able to stabilize wrist better ?Baseline: Patient collapsed into flexion at wrist with any grip and prehension. ?Goal status: INITIAL ?ASSESSMENT: ?  ?CLINICAL IMPRESSION: ?Patient presented OT evaluation postop  anterior fusion of C5-C6.  Patient referred to OT for decreased forearm wrist and hand weakness. Pt return today for second visit since evaluation. Doing well -surgeon did tell her it will take about year. Done HEP.

## 2021-07-12 ENCOUNTER — Encounter: Payer: BC Managed Care – PPO | Admitting: Physical Therapy

## 2021-07-16 ENCOUNTER — Ambulatory Visit: Payer: BC Managed Care – PPO | Attending: Neurosurgery | Admitting: Occupational Therapy

## 2021-07-16 DIAGNOSIS — M25612 Stiffness of left shoulder, not elsewhere classified: Secondary | ICD-10-CM | POA: Diagnosis present

## 2021-07-16 DIAGNOSIS — M25632 Stiffness of left wrist, not elsewhere classified: Secondary | ICD-10-CM | POA: Insufficient documentation

## 2021-07-16 DIAGNOSIS — M542 Cervicalgia: Secondary | ICD-10-CM | POA: Insufficient documentation

## 2021-07-16 DIAGNOSIS — M25642 Stiffness of left hand, not elsewhere classified: Secondary | ICD-10-CM | POA: Diagnosis present

## 2021-07-16 DIAGNOSIS — M6281 Muscle weakness (generalized): Secondary | ICD-10-CM | POA: Diagnosis present

## 2021-07-16 NOTE — Therapy (Signed)
[place] ?[facility] ?[address] ?[place], [place], [address] ?Phone: [phone]   Fax:  [phone] ? ?Occupational Therapy Treatment ? ?Patient Details  ?Name: Lori Calhoun ?MRN: [id number] ?Date of Birth: 10/11/1970 ?Referring Provider (OT): Myer Haff ? ? ?Encounter Date: 07/16/2021 ? ? OT End of Session - 07/16/21 2032   ? ? Visit Number 4   ? Number of Visits 8   ? Date for OT Re-Evaluation 09/10/21   ? OT Start Time 1645   ? OT Stop Time 1730   ? OT Time Calculation (min) 45 min   ? Activity Tolerance Patient tolerated treatment well   ? Behavior During Therapy [facility] for tasks assessed/performed   ? ?  ?  ? ?  ? ? ?Past Medical History:  ?Diagnosis Date  ? Diabetes mellitus, type 2 (HCC)   ? Hypertension   ? Peripheral neuropathy   ? feet  ? ? ?Past Surgical History:  ?Procedure Laterality Date  ? adnoids  Bilateral 1979  ? AMPUTATION TOE Right 04/15/2018  ? Procedure: RAY RIGHT 5TH;  Surgeon: Gwyneth Revels, DPM;  Location: [facility];  Service: Podiatry;  Laterality: Right;  general with local ?Diabetic - oral meds  ? ANTERIOR CERVICAL DECOMP/DISCECTOMY FUSION N/A 05/14/2021  ? Procedure: C5-6 ANTERIOR CERVICAL DISCECTOMY AND FUSION (GLOBUS HEDRON);  Surgeon: Venetia Night, MD;  Location: [facility];  Service: Neurosurgery;  Laterality: N/A;  ? CERVICAL SPINE SURGERY    ? plate at [id number]  ? LAPAROSCOPIC SUPRACERVICAL HYSTERECTOMY  2014  ? right wrist surgery  2022  ? ? ?There were no vitals filed for this visit. ? ? Subjective Assessment - 07/16/21 2031   ? ? Subjective  I feel like my hand is about the same- and at work I am seeing what I can't do and it is frustrating   ? Pertinent History 05/03/21 Per Dr. Lucienne Capers Note   Assessment and Plan:  Lori Calhoun is a pleasant 51 y.o. female with cervical myelopathy due to cervical stenosis at C5-6 from adjacent segment disease. She has worsening symptoms over the past 2 months. She has objective  findings on exam including weakness of her left arm as well as hyperreflexia. She is unable to do tandem walk and has loss of balance.    There is no role for conservative management and cervical myelopathy. I recommended surgical intervention with a C5-6 anterior cervical discectomy and fusion. We will send her for vocal cord clearance.    I discussed the planned procedure at length with the patient, including the risks, benefits, alternatives, and indications. The risks discussed include but are not limited to bleeding, infection, need for reoperation, spinal fluid leak, stroke, vision loss, anesthetic complication, coma, paralysis, and even death. We also discussed the possibility of post-operative dysphagia, vocal cord paralysis, and the risk of adjacent segment disease in the future. I also described in detail that improvement was not guaranteed.    The patient expressed understanding of these risks, and asked that we proceed with surgery. I described the surgery in layman's terms, and gave ample opportunity   ? Currently in Pain? No/denies   ? ?  ?  ? ?  ? ? ? ? ? [facility] OT Assessment - 07/16/21 0001   ? ?  ? Strength  ? Left Hand Grip (lbs) 22   ? Left Hand Lateral Pinch 4 lbs   ? Left Hand 3 Point Pinch 6 lbs   ? ?  ?  ? ?  ? ? ? ? ? ?  TODAY'S TREATMENT: I reviewed home program from last time  for all forearm, wrist and digits and upgrade  ?Assess grip and prehension today see flowsheet patient collapses at wrist in flexion during grip and three-point pinch.   ?  ?Pt to cont with Three-quarter cuff weight around hand for supination, pronation with elbow to side able to increase to 2 times 12 reps.Less compensation and stronger motion. In session able to do 1 lbs cuff weight  ?Place and hold wrist extension  3/4 weight against gravity-able to get about 50 degrees of extention 8-12 reps can do 2 sets.  Weightbearing in between - done 1 lbs this date but not able to do as high or many reps ?ulnar /radial deviation   this date attempt 1 lbs  then 16oz hammer - able to do over edge of table - able to maintain mid position /neutral but has to focus hard,- attempted 1 lbs at side of body - but compensate into wrist flexion  ? Pt to cont with weightbearing in between   ?  ?Sliding thumb radial abduction on paper with 3/4 motion- and then rolling over thumb and palm from palm down to neutral while maintaining extention 8 reps - 12 reps-continue same home program; Thumb RA able to add rubber band today 2x 12 reps  ?Pt to  do light weightbearing through palm to stretch flexors of forearm and hand prior to any wrist  and especially digits extension home exercises.  ? Had better success after flexor stretches.  Attempt tapping of digits able to do second digit 12 reps  ? Rlolling digits over roller and maintaining all digits in extention off roller- 8 reps to 12 reps ?Can cont to use a wrist brace at night or during heavy activity to stabilize wrist. ?Did fit patient with a Benik neoprene to use during home exercises to cue wrist to stay neutral without being dependent on the hard brace or no brace. ?After review patient with light blue for gripping but stopped before wrist collapses and flexion 2 sets of 8, and using blue neoprene wrist splint for lateral pinch and three-point pinch 2 sets of 8. ?  opposition picking up 1 or 2 cm foam block alternating digits.  I have a hard time extending fourth digit during opposition to fifth can use 4th and 5th together - focus on extending digits if not picking up. Then sticky golf ball opposition from palm to  all digits  and then to do 4th/5th digit <> palm 30 sec x 5 . ?  ?  ?  ?PATIENT EDUCATION: ?Education details: progress, nerve healing and protocol for rehab, home exercises ?Person educated: Patient was educated  ?education method: Explanation, Demonstration, Tactile cues, Verbal cues, and Handouts ?Education comprehension: verbalized understanding, returned demonstration, verbal cues  required, and needs further education ?  ?  ?  ?GOALS: ?Goals reviewed with patient? Yes ?  ?SHORT TERM GOALS: Target date: 07/16/2021 ?  ?Patient be independent to decrease flexor tone and show increase  wrist extension and digit extension ?Baseline: Wrist extension partial range has to do placed on hold, able to tap second digit unable to do second and third, fifth initiation of extension but unable to do place and hold ?Goal status: INITIAL ?LONG TERM GOALS: Target date: You ?  ?Left wrist and forearm strength increased to 4+/5 10 doorknob, hold drink an open jar without wrist collapsing ?Baseline: Decreased strength in pronation more than supination, wrist extension and ulnar deviation being 3-3  minus/5 strength-compensating with abduction of elbow external rotation ?Goal status: INITIAL ?  ?2.  Left digits extension increased for patient to release large cylinder up check. ?Baseline: Unable to extend second and third digits, trace of extension on fifth able to tap the second of table after light weightbearing through palm ?Goal status: INITIAL ?  ?3.  Left thumb radial abduction and opposition increase for patient to be able to do rubber band for ponytail-if needed can simulate ?Baseline: Patient unable to to show extension of third and fourth digits, trace of fifth had to cut her hair unable to do ponytail because of decreased extension of digits and thumb radial abduction ?Goal status: INITIAL ?  ?4.  Left wrist extension increase for patient to squeeze toothpaste squeeze washcloth and do buttons without collapsing into flexion ?Baseline: Patient unable to stabilize neutral wrist during any gripping or pinches for ADLs ?Goal status: INITIAL ?  ?5.  Assist grip and prehension strength when patient able to stabilize wrist better ?Baseline: Patient collapsed into flexion at wrist with any grip and prehension. ?Goal status: INITIAL ?ASSESSMENT: ?  ?CLINICAL IMPRESSION: ?Patient presented OT evaluation postop  anterior fusion of C5-C6.  Patient referred to OT for decreased forearm wrist and hand weakness. Pt return today for second visit since evaluation. Doing well -surgeon did tell her it will take about year. Done H

## 2021-07-17 ENCOUNTER — Ambulatory Visit: Payer: BC Managed Care – PPO

## 2021-07-17 DIAGNOSIS — M6281 Muscle weakness (generalized): Secondary | ICD-10-CM | POA: Diagnosis not present

## 2021-07-17 DIAGNOSIS — M25612 Stiffness of left shoulder, not elsewhere classified: Secondary | ICD-10-CM

## 2021-07-18 NOTE — Therapy (Signed)
[place] ?[place] [facility] ?[address] ?[place], [place], [address] ?Phone: [phone]   Fax:  [phone] ? ?Physical Therapy Treatment ? ?Patient Details  ?Name: Lori Calhoun ?MRN: [id number] ?Date of Birth: January 28, 1971 ?No data recorded ? ?Encounter Date: 07/17/2021 ? ? PT End of Session - 07/18/21 0958   ? ? Visit Number 6   ? Number of Visits 16   ? Date for PT Re-Evaluation 08/02/21   ? Authorization Type [employer] COMM pro- 20 VL per calendar year   ? Authorization Time Period 06/07/21-08/02/21   ? Authorization - Visit Number 6   ? Authorization - Number of Visits 20   ? Progress Note Due on Visit 10   ? PT Start Time [id number]   ? PT Stop Time 1800   ? PT Time Calculation (min) 40 min   ? Activity Tolerance Patient tolerated treatment well;No increased pain;Patient limited by fatigue   ? Behavior During Therapy [facility] for tasks assessed/performed   ? ?  ?  ? ?  ? ? ?Past Medical History:  ?Diagnosis Date  ? Diabetes mellitus, type 2 ([place])   ? Hypertension   ? Peripheral neuropathy   ? feet  ? ? ?Past Surgical History:  ?Procedure Laterality Date  ? adnoids  Bilateral 1979  ? AMPUTATION TOE Right 04/15/2018  ? Procedure: RAY RIGHT 5TH;  Surgeon: Samara Deist, DPM;  Location: [facility];  Service: Podiatry;  Laterality: Right;  general with local ?Diabetic - oral meds  ? ANTERIOR CERVICAL DECOMP/DISCECTOMY FUSION N/A 05/14/2021  ? Procedure: C5-6 ANTERIOR CERVICAL DISCECTOMY AND FUSION (GLOBUS HEDRON);  Surgeon: Meade Maw, MD;  Location: [facility];  Service: Neurosurgery;  Laterality: N/A;  ? CERVICAL SPINE SURGERY    ? plate at [id number]  ? LAPAROSCOPIC SUPRACERVICAL HYSTERECTOMY  2014  ? right wrist surgery  2022  ? ? ?There were no vitals filed for this visit. ? ? Subjective Assessment - 07/17/21 1722   ? ? Subjective Pt doing well today, lot of lifting at work. Pain is slightly elevated to 3/10   ? Pertinent History Lori Calhoun is a 89yoF presenting to  [facility] OPPT s/p ACDF C5-6 c Dr. Izora Ribas 05/14/21. Remote history of prior level ACDF. Per Dr. Izora Ribas pt presenting with cervical myelopathy prior to surgery. Upon evlauation with PT 06/07/21 pt noted to have motor palsy at left elbow and wrsit extensors, shoulder internal rotators- this finding is new since surgery.   ? ?  ?  ? ?  ? ?INTERVENTION THIS DATE:  ? ?-UBE Seat 12 - 5 minutes, level 3, alternating fwd/backward Q60sec  ? ?-seated cervical rotation A/ROM 12x bilat (lt limited >Rt)  ?-seated cervical extension 1x15  ? ?-seated capital flexion stretch + suboccipital myofascial release and stretch ?-suboccipital self mobilization (capital flexion) 1x20 bilat  ?-standing bilat shoulder ABDCT to 90 x15 ?-seated LUE gravity reduced AA/ROM, AR/ROM left elbow extension 1x15  ? ?*Supine on plinth  ?TENS application to Left triceps brachii ? ?-Gravity reduced Manually assisted/resisted exercise of LUE 3x15 each: elbow extension, GHJ ER, GHJ IR, GHJ horizontal ADD ? ? ? ? ? PT Education - 07/18/21 1001   ? ? Education Details explained role of TENS in rehab   ? Person(s) Educated Patient   ? Methods Explanation;Demonstration   ? Comprehension Verbalized understanding;Returned demonstration;Need further instruction   ? ?  ?  ? ?  ? ? ?HOME EXERCISE PROGRAM: ?  ?Access Code: [email or web address] ?URL: [email or web address] ?  Date: 06/25/2021 ?Prepared by: Bradly Chris ?  ?Exercises ?- Seated Shoulder Flexion AAROM with Pulley Behind  - 1 x daily - 7 x weekly - 3 sets - 10 reps ?- Seated Shoulder Flexion AAROM with Pulley Behind  - 1 x daily - 7 x weekly - 3 sets - 10 reps ?- Standing Isometric Shoulder External Rotation with Doorway  - 1 x daily - 3 x weekly - 2 sets - 5 reps - 5 hold ?- Standing Isometric Shoulder Internal Rotation at Doorway  - 1 x daily - 3 x weekly - 2 sets - 5 reps - 5 hold ? ? ?OBJECTIVE IMPAIRMENTS decreased ROM, decreased strength, and impaired UE functional use.  ?  ?ACTIVITY LIMITATIONS  cleaning, occupation, and laundry.  ?  ?PERSONAL FACTORS Past/current experiences are also affecting patient's functional outcome. ? ? ?GOALS: ?Goals reviewed with patient? Yes ?  ?SHORT TERM GOALS: Target date: 06/21/2021 ?  ?Pt will be independent with HEP in order to improve strength and balance in order to decrease fall risk and improve function at home and work. ?  ?Baseline: NT  ?Goal status: INITIAL ?  ?Pt will demonstrate understanding of self mobilization of left shoulder and elbow joint to reduce risk of frozen joints.  ?  ?Baseline: NT  ?Goal status: INITIAL ?  ?  ?  ?LONG TERM GOALS: Target date: 08/02/2021 ?  ?Patient will have improved function and activity level as evidenced by an increase in FOTO score by 10 points or more.  ?Baseline: 40/61 ?Goal status: INITIAL ?  ?2.  Patient will improve left shoulder AROM to be within 10% of that of right shoulder for flexion and abduction to perform overhead reaching for job related tasks.  ?Baseline: Shoulder Flex R/L 180/120, Shoulder Abduction 180/120 ?Goal status: INITIAL ? ? ? ? ? ? ? ? ? ? Plan - 07/18/21 1001   ? ? Clinical Impression Statement Conitnued with gentle ROM in postsurgical neck and neuro rehabilitation of LUE muscle groups affected by motor nerve palsy. Pt remains focused, calm, and motivated, reports considerable fatigue at end of session. Author able to visualize improve function of wrist extension and make note of triceps activation improvements, but both remain phenomenally weak.Pec major also noted to be remarkably weak but not suspected to be a new phenomenon. Pt making gradual progress toward goals thus far.   ? Rehab Potential Excellent   ? PT Next Visit Plan Continued with gentle cervical ROM restoration and capital soft tissue work, neuro rehab of LUE   ? PT Home Exercise Plan No updates this session   ? Consulted and Agree with Plan of Care Patient   ? ?  ?  ? ?  ? ? ?Patient will benefit from skilled therapeutic intervention in  order to improve the following deficits and impairments:    ? ?Visit Diagnosis: ?Muscle weakness (generalized) ? ?Stiffness of left shoulder, not elsewhere classified ? ? ? ? ?Problem List ?Patient Active Problem List  ? Diagnosis Date Noted  ? Benzodiazepine overdose 11/04/2016  ? Adjustment disorder with mixed disturbance of emotions and conduct 11/04/2016  ? Marijuana abuse 11/04/2016  ? ?10:18 AM, 07/18/21 ?Etta Grandchild, PT, DPT ?Physical Therapist - [place] ?[phone] (Office) ? ? ?Stokes Rattigan C, PT ?07/18/2021, 10:16 AM ? ? ?[place] [facility] ?[address] ?[place], [place], [address] ?Phone: [phone]   Fax:  [phone] ? ?Name: Lori Calhoun ?MRN: [id number] ?Date of Birth: Oct 01, 1970 ? ? ? ?

## 2021-07-23 ENCOUNTER — Ambulatory Visit: Payer: BC Managed Care – PPO | Admitting: Occupational Therapy

## 2021-07-23 DIAGNOSIS — M25642 Stiffness of left hand, not elsewhere classified: Secondary | ICD-10-CM

## 2021-07-23 DIAGNOSIS — M6281 Muscle weakness (generalized): Secondary | ICD-10-CM

## 2021-07-23 DIAGNOSIS — M25632 Stiffness of left wrist, not elsewhere classified: Secondary | ICD-10-CM

## 2021-07-23 NOTE — Therapy (Signed)
Lori Calhoun ?[facility] ?[address] ?[place], [place], [address] ?Phone: [phone]   Fax:  [phone] ? ?Occupational Therapy Treatment ? ?Patient Details  ?Name: Lori Calhoun ?MRN: [id number] ?Date of Birth: May 05, 1970 ?Referring Provider (OT): Lori Calhoun ? ? ?Encounter Date: 07/23/2021 ? ? OT End of Session - 07/23/21 1719   ? ? Visit Number 5   ? Number of Visits 8   ? Date for OT Re-Evaluation 09/10/21   ? OT Start Time 1640   ? OT Stop Time 1719   ? OT Time Calculation (min) 39 min   ? Activity Tolerance Patient tolerated treatment well   ? Behavior During Therapy [facility] for tasks assessed/performed   ? ?  ?  ? ?  ? ? ?Past Medical History:  ?Diagnosis Date  ? Diabetes mellitus, type 2 (HCC)   ? Hypertension   ? Peripheral neuropathy   ? feet  ? ? ?Past Surgical History:  ?Procedure Laterality Date  ? adnoids  Bilateral 1979  ? AMPUTATION TOE Right 04/15/2018  ? Procedure: RAY RIGHT 5TH;  Surgeon: Lori Calhoun, DPM;  Location: [facility];  Service: Podiatry;  Laterality: Right;  general with local ?Diabetic - oral meds  ? ANTERIOR CERVICAL DECOMP/DISCECTOMY FUSION N/A 05/14/2021  ? Procedure: C5-6 ANTERIOR CERVICAL DISCECTOMY AND FUSION (GLOBUS HEDRON);  Surgeon: Lori Night, MD;  Location: [facility];  Service: Neurosurgery;  Laterality: N/A;  ? CERVICAL SPINE SURGERY    ? plate at B1-D1  ? LAPAROSCOPIC SUPRACERVICAL HYSTERECTOMY  2014  ? right wrist surgery  2022  ? ? ?There were no vitals filed for this visit. ? ? Subjective Assessment - 07/23/21 1718   ? ? Subjective  I see my hand every day so I don't see progress. But if I get here and do things with you I can see it if you point it out   ? Pertinent History 05/03/21 Per Dr. Lucienne Calhoun Note   Assessment and Plan:  Ms. Lori Calhoun is a pleasant 51 y.o. female with cervical myelopathy due to cervical stenosis at C5-6 from adjacent segment disease. She has worsening symptoms over the past 2 months.  She has objective findings on exam including weakness of her left arm as well as hyperreflexia. She is unable to do tandem walk and has loss of balance.    There is no role for conservative management and cervical myelopathy. I recommended surgical intervention with a C5-6 anterior cervical discectomy and fusion. We will send her for vocal cord clearance.    I discussed the planned procedure at length with the patient, including the risks, benefits, alternatives, and indications. The risks discussed include but are not limited to bleeding, infection, need for reoperation, spinal fluid leak, stroke, vision loss, anesthetic complication, coma, paralysis, and even death. We also discussed the possibility of post-operative dysphagia, vocal cord paralysis, and the risk of adjacent segment disease in the future. I also described in detail that improvement was not guaranteed.    The patient expressed understanding of these risks, and asked that we proceed with surgery. I described the surgery in layman's terms, and gave ample opportunity   ? Currently in Pain? No/denies   ? ?  ?  ? ?  ? ? ? ? ? ? ? ? ?TODAY'S TREATMENT: Reassess patient's strength at wrist and digits for upgrade of home exercise program ?  ?Patient this date able to do 1 pound weight, dumbbell for supination pronation 2 sets of 8  able to maintain neutral and not dropping into flexion.  ?Also able to upgrade to 1 pound weight for ulnar and radial deviation over table able to maintain neutral 2 sets of 8 ?Marland Kitchen  Upon assessment patient was able to do do dumbbell 1 pound weight wrist extension placed on hold 2 sets of 7.   ? ?Weightbearing in between -  ?  ?Patient to continue with the same thumb home exercises as well's coordination fine motor.  Sliding thumb radial abduction on paper with 3/4 motion- and then rolling over thumb and palm from palm down to neutral while maintaining extention 8 reps - 12 reps-continue same home program; Thumb RA able to add rubber  band today 2x 12 reps  ?Pt to  do light weightbearing through palm to stretch flexors of forearm and hand prior to any wrist  and especially digits extension home exercises.  ? ?This date changed digit extension to patient to pick up hand and maintaining digit extension off table with fingers and abduction.  Able to do 2 sets of 8.  Then large container like oatmeal container patient able to Income off into extension 2 sets of 8.   ?Smaller container of 10 inch cylinder cupping digit and coming off and extension able to do 2 sets of 5 reps.   ?Patient this date better with maintaining fifth digit extension same height than second digit of table and large container.  Second digit was at [facility] -50 degrees.   ?Fourth digit extension the worst. ?Patient patient can do a lumbrical fist and if block she can do intrinsic a fist with PIP extension. ? ?Can cont to use a wrist brace at Calhoun or during heavy activity to stabilize wrist. ?Did fit patient with a Benik neoprene to use during home exercises to cue wrist to stay neutral without being dependent on the hard brace or no brace. ?After review patient with light blue for gripping but stopped before wrist collapses and flexion 2 sets of 8, and using blue neoprene wrist splint for lateral pinch and three-point pinch 2 sets of 8. ?  opposition picking up 1 or 2 cm foam block alternating digits.  I have a hard time extending fourth digit during opposition to fifth can use 4th and 5th together - focus on extending digits if not picking up. Then sticky golf ball opposition from palm to  all digits  and then to do 4th/5th digit <> palm 30 sec x 5 . ?  ?  ?  ?PATIENT EDUCATION: ?Education details: progress, nerve healing and protocol for rehab, home exercises ?Person educated: Patient was educated  ?education method: Explanation, Demonstration, Tactile cues, Verbal cues, and Handouts ?Education comprehension: verbalized understanding, returned demonstration, verbal cues required,  and needs further education ?  ?  ?  ?GOALS: ?Goals reviewed with patient? Yes ?  ?SHORT TERM GOALS: Target date: 07/16/2021 ?  ?Patient be independent to decrease flexor tone and show increase  wrist extension and digit extension ?Baseline: Wrist extension partial range has to do placed on hold, able to tap second digit unable to do second and third, fifth initiation of extension but unable to do place and hold ?Goal status: INITIAL ?LONG TERM GOALS: Target date: You ?  ?Left wrist and forearm strength increased to 4+/5 10 doorknob, hold drink an open jar without wrist collapsing ?Baseline: Decreased strength in pronation more than supination, wrist extension and ulnar deviation being 3-3 minus/5 strength-compensating with abduction of elbow external rotation ?Goal status: INITIAL ?  ?  2.  Left digits extension increased for patient to release large cylinder up check. ?Baseline: Unable to extend second and third digits, trace of extension on fifth able to tap the second of table after light weightbearing through palm ?Goal status: INITIAL ?  ?3.  Left thumb radial abduction and opposition increase for patient to be able to do rubber band for ponytail-if needed can simulate ?Baseline: Patient unable to to show extension of third and fourth digits, trace of fifth had to cut her hair unable to do ponytail because of decreased extension of digits and thumb radial abduction ?Goal status: INITIAL ?  ?4.  Left wrist extension increase for patient to squeeze toothpaste squeeze washcloth and do buttons without collapsing into flexion ?Baseline: Patient unable to stabilize neutral wrist during any gripping or pinches for ADLs ?Goal status: INITIAL ?  ?5.  Assist grip and prehension strength when patient able to stabilize wrist better ?Baseline: Patient collapsed into flexion at wrist with any grip and prehension. ?Goal status: INITIAL ?ASSESSMENT: ?  ?CLINICAL IMPRESSION: ?Patient presented OT evaluation postop anterior fusion  of C5-C6.  Patient referred to OT for decreased forearm wrist and hand weakness. Pt return today for second visit since evaluation. Doing well -surgeon did tell her it will take about year. Done HEP.

## 2021-07-24 ENCOUNTER — Ambulatory Visit: Payer: BC Managed Care – PPO

## 2021-07-24 DIAGNOSIS — M6281 Muscle weakness (generalized): Secondary | ICD-10-CM | POA: Diagnosis not present

## 2021-07-24 DIAGNOSIS — M25632 Stiffness of left wrist, not elsewhere classified: Secondary | ICD-10-CM

## 2021-07-24 NOTE — Therapy (Signed)
[place] ?[facility] ?[address] ?[place], [place], [address] ?Phone: [phone]   Fax:  [phone] ? ?Physical Therapy Treatment ? ?Patient Details  ?Name: Lori Calhoun ?MRN: [id number] ?Date of Birth: September 09, 1970 ?No data recorded ? ?Encounter Date: 07/24/2021 ? ? PT End of Session - 07/24/21 2154   ? ? Visit Number 7   ? Number of Visits 16   ? Date for PT Re-Evaluation 08/02/21   ? Authorization Type [employer] COMM pro- 20 VL per calendar year   ? Authorization Time Period 06/07/21-08/02/21   ? Authorization - Visit Number 7   ? Authorization - Number of Visits 20   ? Progress Note Due on Visit 10   ? PT Start Time 1720   ? PT Stop Time 1800   ? PT Time Calculation (min) 40 min   ? Activity Tolerance Patient tolerated treatment well;No increased pain;Patient limited by fatigue   ? Behavior During Therapy [facility] for tasks assessed/performed   ? ?  ?  ? ?  ? ? ?Past Medical History:  ?Diagnosis Date  ? Diabetes mellitus, type 2 (HCC)   ? Hypertension   ? Peripheral neuropathy   ? feet  ? ? ?Past Surgical History:  ?Procedure Laterality Date  ? adnoids  Bilateral 1979  ? AMPUTATION TOE Right 04/15/2018  ? Procedure: RAY RIGHT 5TH;  Surgeon: Gwyneth Revels, DPM;  Location: [facility];  Service: Podiatry;  Laterality: Right;  general with local ?Diabetic - oral meds  ? ANTERIOR CERVICAL DECOMP/DISCECTOMY FUSION N/A 05/14/2021  ? Procedure: C5-6 ANTERIOR CERVICAL DISCECTOMY AND FUSION (GLOBUS HEDRON);  Surgeon: Venetia Night, MD;  Location: [facility];  Service: Neurosurgery;  Laterality: N/A;  ? CERVICAL SPINE SURGERY    ? plate at P3-X9  ? LAPAROSCOPIC SUPRACERVICAL HYSTERECTOMY  2014  ? right wrist surgery  2022  ? ? ?There were no vitals filed for this visit. ? ? Subjective Assessment - 07/24/21 2154   ? ? Subjective Pt doing well today, lot of lifting at work still. Neck is stif, but responds to hot shower.   ? Pertinent History Lori Calhoun is a 50yoF  presenting to [place] [facility] s/p ACDF C5-6 c Dr. Myer Haff 05/14/21. Remote history of prior level ACDF. Per Dr. Myer Haff pt presenting with cervical myelopathy prior to surgery. Upon evlauation with PT 06/07/21 pt noted to have motor palsy at left elbow and wrsit extensors, shoulder internal rotators- this finding is new since surgery.   ? Currently in Pain? No/denies   ? ?  ?  ? ?  ? ?  Intervention 07/24/21 ? ?Cervical ROM, activation ?-UBE Seat 12, 10 - 4 minutes, level 3-2, alternating fwd/backward Q60sec  ?  ?-seated cervical rotation A/ROM 20x bilat (lt limited >Rt)  ?-seated cervical extension 1x15x5secH  ?-capital flexion stretch 3x30sec  ? ?-standing bilat shoulder ABDCT to 90 x15 ?-seated cable rowdown 20lb 1x15 ?-standing bilat shoulder ABDCT to 90 x15 ?-seated cable rowdown 20lb 1x15 ? ?Neuromotor reeducation: ?Supine, gravity reduced.. ?-AA/ROM Left elbow extension skull crusher 1x15 ?-AA/ROM Left shoulder IR from end range external rotation 1x15 ?-AA/ROM Left shoulder flys 1x15 ?-AA/ROM Left elbow extension skull crusher 1x15 ?-AA/ROM Left shoulder IR from end range external rotation 1x15 ?-AA/ROM Left shoulder flys 1x15 ?-AA/ROM Left elbow extension skull crusher 1x15 ?-AA/ROM Left shoulder IR from end range external rotation 1x15 ?-AA/ROM Left shoulder flys 1x15 ?*reporting some hand paresthesia during some of these ? ? ? ? ? PT  Education - 07/24/21 2155   ? ? Education Details how to stretch suboccipitals   ? Person(s) Educated Patient   ? Methods Explanation;Demonstration;Tactile cues   ? Comprehension Verbalized understanding;Need further instruction   ? ?  ?  ? ?  ? ? ?HOME EXERCISE PROGRAM: ? Access Code: [email or web address] ?URL: [email or web address] ?Date: 07/04/2021 ?Prepared by: Ellin Goodieaniel Fleming ?  ?Exercises ?- Seated Shoulder Flexion AAROM with Pulley Behind  - 1 x daily - 7 x weekly - 3 sets - 10 reps ?- Seated Shoulder Flexion AAROM with Pulley Behind  - 1 x daily - 7 x weekly - 3 sets - 10  reps ?- Standing Isometric Shoulder Flexion with Doorway - Arm Bent  - 1 x daily - 3 x weekly - 3 sets - 10 reps - 3 hold ?- Standing Isometric Shoulder Extension with Doorway - Arm Bent  - 1 x daily - 3 x weekly - 3 sets - 10 reps - 3 hold ?- Shoulder External Rotation with Anchored Resistance  - 1 x daily - 3 x weekly - 3 sets - 10 reps ?- Shoulder Internal Rotation with Resistance  - 1 x daily - 3 x weekly - 3 sets - 10 reps ?  ? ?OBJECTIVE IMPAIRMENTS decreased ROM, decreased strength, and impaired UE functional use.  ?  ?ACTIVITY LIMITATIONS cleaning, occupation, and laundry.  ?  ?PERSONAL FACTORS Past/current experiences are also affecting patient's functional outcome.  ?  ?  ?REHAB POTENTIAL: Fair unclear of how much motor control of left triceps can be regained through PT ?  ?CLINICAL DECISION MAKING: Evolving/moderate complexity ?  ?EVALUATION COMPLEXITY: Moderate ?  ?  ?GOALS: ?Goals reviewed with patient? Yes ?  ?SHORT TERM GOALS: Target date: 06/21/2021 ?  ?Pt will be independent with HEP in order to improve strength and balance in order to decrease fall risk and improve function at home and work. ?  ?Baseline: NT  ?Goal status: INITIAL ?  ?Pt will demonstrate understanding of self mobilization of left shoulder and elbow joint to reduce risk of frozen joints.  ?  ?Baseline: NT  ?Goal status: INITIAL ?  ?  ?  ?LONG TERM GOALS: Target date: 08/02/2021 ?  ?Patient will have improved function and activity level as evidenced by an increase in FOTO score by 10 points or more.  ?Baseline: 40/61 ?Goal status: INITIAL ?  ?2.  Patient will improve left shoulder AROM to be within 10% of that of right shoulder for flexion and abduction to perform overhead reaching for job related tasks.  ?Baseline: Shoulder Flex R/L 180/120, Shoulder Abduction 180/120 ?Goal status: INITIAL ?  ?  ?  ?PLAN: ?PT FREQUENCY: 1-2x/week ?  ?PT DURATION: 8 weeks ?  ?PLANNED INTERVENTIONS: Therapeutic exercises, Therapeutic activity,  Neuromuscular re-education, Balance training, Gait training, Patient/Family education, Joint manipulation, Joint mobilization, Aquatic Therapy, Dry Needling, Electrical stimulation, Moist heat, and Manual therapy ? ? ? ? ? ? ? ? ? ? Plan - 07/24/21 2155   ? ? Clinical Impression Statement Conitnued with gentle ROM in postsurgical neck and neuro rehabilitation of LUE muscle groups affected by motor nerve palsy. Pt remains focused, calm, and motivated, reports considerable fatigue at end of session. Force production in left elbow extension, shoulder IR, and horizontal ADD remains remarkably weak. Pt making gradual progress toward goals thus far.   ? Rehab Potential Excellent   ? PT Next Visit Plan Continued with gentle cervical ROM restoration and capital soft tissue work, neuro rehab of LUE   ?  PT Home Exercise Plan No updates this session   ? Consulted and Agree with Plan of Care Patient   ? ?  ?  ? ?  ? ? ?Patient will benefit from skilled therapeutic intervention in order to improve the following deficits and impairments:    ? ?Visit Diagnosis: ?Muscle weakness (generalized) ? ?Stiffness of left wrist, not elsewhere classified ? ? ? ? ?Problem List ?Patient Active Problem List  ? Diagnosis Date Noted  ? Benzodiazepine overdose 11/04/2016  ? Adjustment disorder with mixed disturbance of emotions and conduct 11/04/2016  ? Marijuana abuse 11/04/2016  ? ?10:03 PM, 07/24/21 ?Rosamaria Lints, PT, DPT ?Physical Therapist [facility] ?[phone] (Office) ? ? ?Janaya Broy C, PT ?07/24/2021, 9:58 PM ? ?[place] ?[facility] ?[address] ?[place], [place], [address] ?Phone: [phone]   Fax:  [phone] ? ?Name: Lori Calhoun ?MRN: [id number] ?Date of Birth: 03-Jun-1970 ? ? ? ?

## 2021-07-26 ENCOUNTER — Encounter: Payer: BC Managed Care – PPO | Admitting: Physical Therapy

## 2021-07-30 ENCOUNTER — Ambulatory Visit: Payer: BC Managed Care – PPO | Admitting: Occupational Therapy

## 2021-07-30 DIAGNOSIS — M25642 Stiffness of left hand, not elsewhere classified: Secondary | ICD-10-CM

## 2021-07-30 DIAGNOSIS — M6281 Muscle weakness (generalized): Secondary | ICD-10-CM | POA: Diagnosis not present

## 2021-07-30 DIAGNOSIS — M25632 Stiffness of left wrist, not elsewhere classified: Secondary | ICD-10-CM

## 2021-07-30 NOTE — Therapy (Signed)
[place] ?[facility] ?[address] ?[place], [place], [address] ?Phone: [phone]   Fax:  [phone] ? ?Occupational Therapy Treatment ? ?Patient Details  ?Name: Lori Calhoun ?MRN: [id number] ?Date of Birth: Mar 21, 1970 ?Referring Provider (OT): Myer Haff ? ? ?Encounter Date: 07/30/2021 ? ? OT End of Session - 07/30/21 1653   ? ? Visit Number 6   ? Number of Visits 8   ? Date for OT Re-Evaluation 09/10/21   ? OT Start Time 1600   ? OT Stop Time 1644   ? OT Time Calculation (min) 44 min   ? Activity Tolerance Patient tolerated treatment well   ? Behavior During Therapy [facility] for tasks assessed/performed   ? ?  ?  ? ?  ? ? ?Past Medical History:  ?Diagnosis Date  ? Diabetes mellitus, type 2 (HCC)   ? Hypertension   ? Peripheral neuropathy   ? feet  ? ? ?Past Surgical History:  ?Procedure Laterality Date  ? adnoids  Bilateral 1979  ? AMPUTATION TOE Right 04/15/2018  ? Procedure: RAY RIGHT 5TH;  Surgeon: Gwyneth Revels, DPM;  Location: [facility];  Service: Podiatry;  Laterality: Right;  general with local ?Diabetic - oral meds  ? ANTERIOR CERVICAL DECOMP/DISCECTOMY FUSION N/A 05/14/2021  ? Procedure: C5-6 ANTERIOR CERVICAL DISCECTOMY AND FUSION (GLOBUS HEDRON);  Surgeon: Venetia Night, MD;  Location: [facility];  Service: Neurosurgery;  Laterality: N/A;  ? CERVICAL SPINE SURGERY    ? plate at L2-G4  ? LAPAROSCOPIC SUPRACERVICAL HYSTERECTOMY  2014  ? right wrist surgery  2022  ? ? ?There were no vitals filed for this visit. ? ? Subjective Assessment - 07/30/21 1652   ? ? Subjective  Is like usually I cannot tell a difference but you every time can see if I am stronger was different to my hand and wrist.   ? Pertinent History 05/03/21 Per Dr. Lucienne Capers Note   Assessment and Plan:  Lori Calhoun is a pleasant 51 y.o. female with cervical myelopathy due to cervical stenosis at C5-6 from adjacent segment disease. She has worsening symptoms over the past 2  months. She has objective findings on exam including weakness of her left arm as well as hyperreflexia. She is unable to do tandem walk and has loss of balance.    There is no role for conservative management and cervical myelopathy. I recommended surgical intervention with a C5-6 anterior cervical discectomy and fusion. We will send her for vocal cord clearance.    I discussed the planned procedure at length with the patient, including the risks, benefits, alternatives, and indications. The risks discussed include but are not limited to bleeding, infection, need for reoperation, spinal fluid leak, stroke, vision loss, anesthetic complication, coma, paralysis, and even death. We also discussed the possibility of post-operative dysphagia, vocal cord paralysis, and the risk of adjacent segment disease in the future. I also described in detail that improvement was not guaranteed.    The patient expressed understanding of these risks, and asked that we proceed with surgery. I described the surgery in layman's terms, and gave ample opportunity   ? Currently in Pain? No/denies   ? ?  ?  ? ?  ? ? ? ? ? [facility] OT Assessment - 07/30/21 0001   ? ?  ? Strength  ? Left Hand Grip (lbs) wrist support in neutral 32 lbs   ? Left Hand Lateral Pinch 4 lbs   ? Left Hand 3 Point Pinch --  7  ? ?  ?  ? ?  ? ? ? ? ?TODAY'S TREATMENT: Reassess patient's strength at wrist and digits for upgrade of home exercise program ?  ?Patient this date able to do 1 pound weight, dumbbell for supination pronation 2 sets of 10-12 able to maintain neutral and not dropping into flexion.  ?Also able to upgrade to 2 pound weight for ulnar and radial deviation over table able to maintain neutral 2 sets of 8 ?Marland Kitchen.  Upon assessment patient was able to do do dumbbell 1 pound weight wrist extension placed on hold 2 sets of 6 OR this date horizontal 2x 10 reps AROM.   ?  ?Weightbearing in between -  ?  ?Patient to continue with the same thumb home exercises as well's  coordination fine motor.  Sliding thumb radial abduction on paper with 3/4 motion- and then rolling over thumb and palm from palm down to neutral while maintaining extention 8 reps - 12 reps-continue same home program; Thumb RA able to add rubber band today 2x 12 reps  ?Pt to  do light weightbearing through palm to stretch flexors of forearm and hand prior to any wrist  and especially digits extension home exercises.  ?  ?Cont with digit extension to patient to pick up hand and maintaining digit extension off table with fingers and abduction.  Able to do 2 sets of 8.  Then large container like oatmeal container patient able to Income off into extension 2 sets of 8.   ?Smaller container of 10 inch cylinder cupping digit and coming off and extension able to do 2 sets of 5 reps.   ?Patient this date better with maintaining fifth digit extension same height than second digit of table and large container.  [facility] and 5th [facility] -35 at end of session.   ?Fourth digit extension the worst. ?Patient patient can do a lumbrical fist and if block she can do intrinsic a fist with PIP extension. ?  ?Can cont to use a wrist brace at night or during heavy activity to stabilize wrist. ?Did fit patient with a Benik neoprene to use during home exercises to cue wrist to stay neutral without being dependent on the hard brace or no brace. ?Cont with light blue for gripping but stopped before wrist collapses into flexion 2 sets of 8, ; lateral pinch and three-point pinch 2 sets of 8. Use neoprene wrist Benik with all of them ?  opposition picking up 1 or 2 cm foam block alternating digits.  I have a hard time extending fourth digit during opposition to fifth can use 4th and 5th together - focus on extending digits if not picking up. Then sticky golf ball opposition from palm to  all digits  and then to do 4th/5th digit <> palm 30 sec x 5 . ?  ?  ?  ?PATIENT EDUCATION: ?Education details: progress, nerve healing and  protocol for rehab, home exercises ?Person educated: Patient was educated  ?education method: Explanation, Demonstration, Tactile cues, Verbal cues, and Handouts ?Education comprehension: verbalized understanding, returned demonstration, verbal cues required, and needs further education ?  ?  ?  ?GOALS: ?Goals reviewed with patient? Yes ?  ?SHORT TERM GOALS: Target date: 07/16/2021 ?  ?Patient be independent to decrease flexor tone and show increase  wrist extension and digit extension ?Baseline: Wrist extension partial range has to do placed on hold, able to tap second digit unable to do second and third,  fifth initiation of extension but unable to do place and hold ?Goal status: INITIAL ?LONG TERM GOALS: Target date: You ?  ?Left wrist and forearm strength increased to 4+/5 10 doorknob, hold drink an open jar without wrist collapsing ?Baseline: Decreased strength in pronation more than supination, wrist extension and ulnar deviation being 3-3 minus/5 strength-compensating with abduction of elbow external rotation ?Goal status: INITIAL ?  ?2.  Left digits extension increased for patient to release large cylinder up check. ?Baseline: Unable to extend second and third digits, trace of extension on fifth able to tap the second of table after light weightbearing through palm ?Goal status: INITIAL ?  ?3.  Left thumb radial abduction and opposition increase for patient to be able to do rubber band for ponytail-if needed can simulate ?Baseline: Patient unable to to show extension of third and fourth digits, trace of fifth had to cut her hair unable to do ponytail because of decreased extension of digits and thumb radial abduction ?Goal status: INITIAL ?  ?4.  Left wrist extension increase for patient to squeeze toothpaste squeeze washcloth and do buttons without collapsing into flexion ?Baseline: Patient unable to stabilize neutral wrist during any gripping or pinches for ADLs ?Goal status: INITIAL ?  ?5.  Assist grip and  prehension strength when patient able to stabilize wrist better ?Baseline: Patient collapsed into flexion at wrist with any grip and prehension. ?Goal status: INITIAL ?ASSESSMENT: ?  ?CLINICAL IMPRESSION:

## 2021-07-31 ENCOUNTER — Ambulatory Visit: Payer: BC Managed Care – PPO | Admitting: Physical Therapy

## 2021-08-02 ENCOUNTER — Ambulatory Visit: Payer: BC Managed Care – PPO | Admitting: Physical Therapy

## 2021-08-02 ENCOUNTER — Encounter: Payer: Self-pay | Admitting: Physical Therapy

## 2021-08-02 DIAGNOSIS — M6281 Muscle weakness (generalized): Secondary | ICD-10-CM | POA: Diagnosis not present

## 2021-08-02 DIAGNOSIS — M542 Cervicalgia: Secondary | ICD-10-CM

## 2021-08-02 DIAGNOSIS — M25612 Stiffness of left shoulder, not elsewhere classified: Secondary | ICD-10-CM

## 2021-08-02 NOTE — Therapy (Signed)
OUTPATIENT PHYSICAL THERAPY TREATMENT NOTE/ Re-certification   Dates of Reporting: 06/07/21-08/02/21   Patient Name: Lori Calhoun MRN: [id number] DOB:[birth year], 51 y.o., female Today's Date: 08/02/2021  PCP: Enid BaasKalisetti, Radhika, MD REFERRING PROVIDER: Venetia NightYarbrough, Chester, MD   PT End of Session - 08/02/21 1722     Visit Number 8    Number of Visits 16    Date for PT Re-Evaluation 08/02/21    Authorization Type [employer] COMM pro- 20 VL per calendar year    Authorization Time Period 06/07/21-08/02/21    Authorization - Visit Number 8    Authorization - Number of Visits 16    Progress Note Due on Visit 10    PT Start Time 1720    PT Stop Time 1800    PT Time Calculation (min) 40 min    Activity Tolerance Patient tolerated treatment well;No increased pain    Behavior During Therapy WFL for tasks assessed/performed              Past Medical History:  Diagnosis Date   Diabetes mellitus, type 2 (HCC)    Hypertension    Peripheral neuropathy    feet   Past Surgical History:  Procedure Laterality Date   adnoids  Bilateral 1979   AMPUTATION TOE Right 04/15/2018   Procedure: RAY RIGHT 5TH;  Surgeon: Gwyneth RevelsFowler, Justin, DPM;  [facility];  Service: Podiatry;  Laterality: Right;  general with local Diabetic - oral meds   ANTERIOR CERVICAL DECOMP/DISCECTOMY FUSION N/A 05/14/2021   Procedure: C5-6 ANTERIOR CERVICAL DISCECTOMY AND FUSION (GLOBUS HEDRON);  Surgeon: Venetia NightYarbrough, Chester, MD;  Location: [facility];  Service: Neurosurgery;  Laterality: N/A;   CERVICAL SPINE SURGERY     plate at [id number]-C7   LAPAROSCOPIC SUPRACERVICAL HYSTERECTOMY  2014   right wrist surgery  2022   Patient Active Problem List   Diagnosis Date Noted   Benzodiazepine overdose 11/04/2016   Adjustment disorder with mixed disturbance of emotions and conduct 11/04/2016   Marijuana abuse 11/04/2016    REFERRING DIAG: Weakness of Left  elbow extensor and Left Shoulder Stiffness   THERAPY DIAG:   Muscle weakness (generalized)  Cervicalgia  Stiffness of left shoulder, not elsewhere classified  PERTINENT HISTORY: 05/03/21 Per Dr. Lucienne CapersYarbrough's Note  Assessment and Plan: Ms. Lori Calhoun is a pleasant 51 y.o. female with cervical myelopathy due to cervical stenosis at C5-6 from adjacent segment disease. She has worsening symptoms over the past 2 months. She has objective findings on exam including weakness of her left arm as well as hyperreflexia. She is unable to do tandem walk and has loss of balance.  There is no role for conservative management and cervical myelopathy. I recommended surgical intervention with a C5-6 anterior cervical discectomy and fusion. We will send her for vocal cord clearance.  I discussed the planned procedure at length with the patient, including the risks, benefits, alternatives, and indications. The risks discussed include but are not limited to bleeding, infection, need for reoperation, spinal fluid leak, stroke, vision loss, anesthetic complication, coma, paralysis, and even death. We also discussed the possibility of post-operative dysphagia, vocal cord paralysis, and the risk of adjacent segment disease in the future. I also described in detail that improvement was not guaranteed.  The patient expressed understanding of these risks, and asked that we proceed with surgery. I described the surgery in layman's terms, and gave ample opportunity for questions, which were answered to the best of my ability.  PRECAUTIONS: Do not lift >10 lbs   SUBJECTIVE: Pt  reports that she feels that her exercises have been going ok, but she has having trouble completing them because of how busy she is at work. She still needs assistance with overhead activities at work   PAIN:  Are you having pain? No   OBJECTIVE:    VITALS: BP 109/74 HR 84 SpO2 100   DIAGNOSTIC FINDINGS:  CLINICAL DATA:  Cervicalgia M54.2 (ICD-10-CM)   EXAM: MRI CERVICAL SPINE WITHOUT CONTRAST    TECHNIQUE: Multiplanar, multisequence MR imaging of the cervical spine was performed. No intravenous contrast was administered.   COMPARISON:  MRI of the cervical spine 02/29/2020.   FINDINGS: Alignment: Similar alignment. Similar straightening with trace retrolisthesis of C7 on T1. Similar trace anterolisthesis of C4 on C5.   Vertebrae: C6-C7 ACDF with solid bony fusion. No focal marrow edema chest acute fracture, discitis/osteomyelitis, or suspicious bone lesion.   Cord: Normal cord signal.   Posterior Fossa, vertebral arteries, paraspinal tissues: Visualized vertebral artery flow voids are maintained. Unremarkable paraspinal soft tissues without evidence of edema. No evidence of acute abnormality in the visualized posterior fossa on limited assessment.   Disc levels:   C2-C3: Mild uncovertebral hypertrophy with bilateral facet arthropathy. No significant stenosis.   C3-C4: Posterior disc osteophyte complex, eccentric to the left with left greater than right facet and uncovertebral hypertrophy. Resulting similar versus mildly progressed moderate left foraminal stenosis with potential left C4 nerve impingement. Similar mild right foraminal stenosis and mild canal stenosis.   C4-C5: Posterior disc osteophyte complex with small central disc protrusion which contacts and flattens the ventral cord. Central disc protrusion is mildly progressed with overall similar mild canal stenosis. Similar mild left foraminal stenosis with patent right foramina.   C5-C6: Left eccentric posterior disc osteophyte complex with left greater than right facet and uncovertebral hypertrophy. Similar resulting moderate to severe spinal canal stenosis with flattening of the cord. Similar severe left greater than right foraminal stenosis.   C6-C7: Prior ACDF with patent canal. At least mild bilateral foraminal stenosis due to spurring, similar.   C7-T1: Left eccentric posterior disc osteophyte  complex with left greater than right facet and uncovertebral hypertrophy. Similar severe left and mild-to-moderate right foraminal stenosis with potential impingement of the exiting left C8 nerve. Similar mild canal stenosis.   IMPRESSION: 1. At C5-C6, similar moderate to severe canal stenosis and severe bilateral neural foraminal stenosis. 2. At C7-T1, similar severe left foraminal stenosis. 3. At C3-C4, similar versus mildly progressed moderate left foraminal stenosis. 4. Additional milder multilevel degenerative change is detailed above.     Electronically Signed   By: Feliberto Harts M.D.   On: 05/02/2021 09:53   CLINICAL DATA:  Fluoroscopic assistance for cervical spine fusion   EXAM: CERVICAL SPINE - 2-3 VIEW   COMPARISON:  None.   FINDINGS: Fluoroscopic images show interval anterior fusion at C5-C6 level. There is previous anterior fusion at C6-C7 level. Fluoroscopic time was 9 seconds. Estimated radiation dose is 3.27 mGy.   IMPRESSION: Fluoroscopic assistance was provided for cervical spinal fusion at C5-C6 level.     Electronically Signed   By: Ernie Avena M.D.   On: 05/14/2021 10:51   PATIENT SURVEYS:  FOTO 40     COGNITION: Overall cognitive status: Within functional limits for tasks assessed     SENSATION: Light touch: Impaired numbness in tingling in posterior side of hand and along posterior side of elbow and forearm along radial nerve distribution.    POSTURE:  Forward head and rounded shoulders  CERVICAL ROM:    Active ROM A/PROM (deg) 06/07/2021  Flexion 30  Extension 30  Right lateral flexion 30  Left lateral flexion 30  Right rotation 30  Left rotation 30   (Blank rows = not tested)   UE ROM:   AROM + PROM ROM Right 06/07/2021 Left 06/07/2021  Shoulder flexion      Shoulder extension 180 120  Shoulder abduction 180 120  Shoulder adduction      Shoulder extension 60 60  Shoulder internal rotation       Shoulder external rotation      Elbow flexion      Elbow extension      Wrist flexion 0 70  Wrist extension 0 80  Wrist ulnar deviation      Wrist radial deviation      Wrist pronation      Wrist supination       (Blank rows = not tested)   UE MMT:   MMT Right 06/07/2021 Left 06/07/2021  Shoulder flexion 5/5 4/5  Shoulder extension      Shoulder abduction 5/5 5/5  Shoulder adduction      Shoulder extension      Shoulder internal rotation      Shoulder external rotation      Middle trapezius      Lower trapezius      Elbow flexion 5/5 5/5  Elbow extension 5/5 3+/5  Wrist flexion NT 5/5  Wrist extension NT 2+/5  Wrist ulnar deviation      Wrist radial deviation      Wrist pronation      Wrist supination      Grip strength       (Blank rows = not tested)                PATIENT SURVEYS:  FOTO 40/61   TODAY'S TREATMENT:  08/02/21  THEREX   UBE Seat 13, 3 min forward and 3 min backward at level 3 resistance  Shoulder AROM  -Flexion R/L 180/150 -Abduction R/L 180/150   Shoulder Flexion with #1 DB 3 x 10  Shoulder Abduction with # 1 DB 3 x 10    07/24/21 Cervical ROM, activation -UBE Seat 12, 10 - 4 minutes, level 3-2, alternating fwd/backward Q60sec    -seated cervical rotation A/ROM 20x bilat (lt limited >Rt)  -seated cervical extension 1x15x5secH  -capital flexion stretch 3x30sec    -standing bilat shoulder ABDCT to 90 x15 -seated cable rowdown 20lb 1x15 -standing bilat shoulder ABDCT to 90 x15 -seated cable rowdown 20lb 1x15  07/04/21  THEREX  UBE Seat 12 - 3 min forward, 3 min backward  Shoulder AAROM Abduction/Adduction 3 x 10  -Unable to extend right elbow at 90 deg abduction Shoulder AAROM Flexion/Extension 3 x 10  -Unable to extend right elbow past 90 deg flexion                       L Shoulder ER at 0 deg abduction with YTB tied to wrist 3 x 10                       L Shoulder IR at 0 deg abduction with YTB tied to wrist 3 x 10    MANUAL Trigger point release of right upper trap Instructed pt about use of theracane and had pt use theracane   06/27/21  UBE no resistance with seat at 12, 3 min forward and 3  min backward   Seated Shoulder ER at 0 deg abduction with # 2 DB 3 x 10    -min TC to stop right trunk rotation     Left Shoulder Flexion AROM  30 deg   -Unable to preform in gravity eliminated position                         Shoulder Flexion Isometric 3 x 10 with 3 sec hold                        Left Shoulder Flexion AROM 45 deg      PATIENT EDUCATION:  Education details: form and technique for appropriate exercise. Explanation about rehab potential to regain triceps function.  Person educated: Patient Education method: Explanation, Demonstration, Verbal cues, and Handouts Education comprehension: verbalized understanding, returned demonstration, and verbal cues required     HOME EXERCISE PROGRAM: Access Code: [email or web address] URL: [email or web address] Date: 08/02/2021 Prepared by: Ellin Goodie  Exercises - Seated Shoulder Flexion AAROM with Pulley Behind  - 1 x daily - 7 x weekly - 3 sets - 10 reps - Seated Shoulder Flexion AAROM with Pulley Behind  - 1 x daily - 7 x weekly - 3 sets - 10 reps - Standing Isometric Shoulder Extension with Doorway - Arm Bent  - 1 x daily - 3 x weekly - 3 sets - 10 reps - 3 hold - Shoulder External Rotation with Anchored Resistance  - 1 x daily - 3 x weekly - 3 sets - 10 reps - Shoulder Internal Rotation with Resistance  - 1 x daily - 3 x weekly - 3 sets - 10 reps - Standing Shoulder Flexion with Resistance  - 1 x daily - 3 x weekly - 3 sets - 10 reps - Shoulder Abduction with Dumbbells - Thumbs Up  - 1 x daily - 3 x weekly - 3 sets - 10 reps ASSESSMENT:   CLINICAL IMPRESSION:    Pt exhibits improved left shoulder AROM with abduction and flexion and an improved self perception of her UE function. She has yet to regain motor control of her triceps when  abducting above 120 degrees and this has not improved significantly since post surgery. She will continue to benefit from skilled PT to achieve left shoulder that is near symmetrical to the right in order to perform overhead tasks required for job duties such as torque measurements and lifting objects overhead.      OBJECTIVE IMPAIRMENTS decreased ROM, decreased strength, and impaired UE functional use.    ACTIVITY LIMITATIONS cleaning, occupation, and laundry.    PERSONAL FACTORS Past/current experiences are also affecting patient's functional outcome.      REHAB POTENTIAL: Fair unclear of how much motor control of left triceps can be regained through PT   CLINICAL DECISION MAKING: Evolving/moderate complexity   EVALUATION COMPLEXITY: Moderate     GOALS: Goals reviewed with patient? Yes   SHORT TERM GOALS: Target date: 06/21/2021   Pt will be independent with HEP in order to improve strength and balance in order to decrease fall risk and improve function at home and work.   Baseline: 08/02/21 Able to perform but only with enough time  Goal status: PARTIALLY ACHIEVED    Pt will demonstrate understanding of self mobilization of left shoulder and elbow joint to reduce risk of frozen joints.    Baseline: 08/02/21: Able to perform independently  Goal status: ACHIEVED  LONG TERM GOALS: Target date: 08/02/2021   Patient will have improved function and activity level as evidenced by an increase in FOTO score by 10 points or more.  Baseline: 40/61 08/02/21: 60/61 Goal status: ACHIEVED    2.  Patient will improve left shoulder AROM to be within 10% of that of right shoulder for flexion and abduction to perform overhead reaching for job related tasks.  Baseline: Shoulder Flex R/L 180/120, Shoulder Abduction 180/120 08/02/21: Shoulder Flex R/L 180/150 Shoulder Abd R/L 180/150  Goal status: PARTIALLY ACHIEVED        PLAN: PT FREQUENCY: 1-2x/week   PT DURATION: 8 weeks   PLANNED  INTERVENTIONS: Therapeutic exercises, Therapeutic activity, Neuromuscular re-education, Balance training, Gait training, Patient/Family education, Joint manipulation, Joint mobilization, Aquatic Therapy, Dry Needling, Electrical stimulation, Moist heat, and Manual therapy   PLAN FOR NEXT SESSION: Attempt exercises without use of wrists. Continue with parascapular and left shoulder strengthening within available ROM.    Ellin Goodie PT, DPT   Johnn Hai, PT 08/02/2021, 5:23 PM

## 2021-08-06 ENCOUNTER — Ambulatory Visit: Payer: BC Managed Care – PPO | Admitting: Occupational Therapy

## 2021-08-07 ENCOUNTER — Encounter: Payer: Self-pay | Admitting: Physical Therapy

## 2021-08-07 ENCOUNTER — Ambulatory Visit: Payer: BC Managed Care – PPO | Admitting: Physical Therapy

## 2021-08-07 DIAGNOSIS — M6281 Muscle weakness (generalized): Secondary | ICD-10-CM | POA: Diagnosis not present

## 2021-08-07 DIAGNOSIS — M25612 Stiffness of left shoulder, not elsewhere classified: Secondary | ICD-10-CM

## 2021-08-07 DIAGNOSIS — M542 Cervicalgia: Secondary | ICD-10-CM

## 2021-08-07 NOTE — Therapy (Signed)
OUTPATIENT PHYSICAL THERAPY TREATMENT NOTE/ Re-certification   Dates of Reporting: 06/07/21-08/02/21   Patient Name: Lori Calhoun MRN: [id number] DOB:09/05/1970, 51 y.o., female Today's Date: 08/07/2021  PCP: Gladstone Lighter, MD REFERRING PROVIDER: Gladstone Lighter, MD   PT End of Session - 08/07/21 1722     Visit Number 9    Number of Visits 16    Date for PT Re-Evaluation 08/02/21    Authorization Type [employer] COMM pro- 20 VL per calendar year    Authorization Time Period 08/03/21-10/03/21    Authorization - Visit Number 9    Authorization - Number of Visits 16    Progress Note Due on Visit 10    PT Start Time [id number]    PT Stop Time 1800    PT Time Calculation (min) 40 min    Activity Tolerance Patient tolerated treatment well;No increased pain    Behavior During Therapy WFL for tasks assessed/performed              Past Medical History:  Diagnosis Date   Diabetes mellitus, type 2 ([place])    Hypertension    Peripheral neuropathy    feet   Past Surgical History:  Procedure Laterality Date   adnoids  Bilateral 1979   AMPUTATION TOE Right 04/15/2018   Procedure: RAY RIGHT 5TH;  Surgeon: Samara Deist, DPM;  Location: [place];  Service: Podiatry;  Laterality: Right;  general with local Diabetic - oral meds   ANTERIOR CERVICAL DECOMP/DISCECTOMY FUSION N/A 05/14/2021   Procedure: C5-6 ANTERIOR CERVICAL DISCECTOMY AND FUSION (GLOBUS HEDRON);  Surgeon: Meade Maw, MD;  Location: [facility];  Service: Neurosurgery;  Laterality: N/A;   CERVICAL SPINE SURGERY     plate at [id number]   LAPAROSCOPIC SUPRACERVICAL HYSTERECTOMY  2014   right wrist surgery  2022   Patient Active Problem List   Diagnosis Date Noted   Benzodiazepine overdose 11/04/2016   Adjustment disorder with mixed disturbance of emotions and conduct 11/04/2016   Marijuana abuse 11/04/2016    REFERRING DIAG: Weakness of Left  elbow extensor and Left Shoulder Stiffness   THERAPY DIAG:   Cervicalgia  Stiffness of left shoulder, not elsewhere classified  PERTINENT HISTORY: 05/03/21 Per Dr. Rhea Bleacher Note  Assessment and Plan: Ms. Ortez is a pleasant 51 y.o. female with cervical myelopathy due to cervical stenosis at C5-6 from adjacent segment disease. She has worsening symptoms over the past 2 months. She has objective findings on exam including weakness of her left arm as well as hyperreflexia. She is unable to do tandem walk and has loss of balance.  There is no role for conservative management and cervical myelopathy. I recommended surgical intervention with a C5-6 anterior cervical discectomy and fusion. We will send her for vocal cord clearance.  I discussed the planned procedure at length with the patient, including the risks, benefits, alternatives, and indications. The risks discussed include but are not limited to bleeding, infection, need for reoperation, spinal fluid leak, stroke, vision loss, anesthetic complication, coma, paralysis, and even death. We also discussed the possibility of post-operative dysphagia, vocal cord paralysis, and the risk of adjacent segment disease in the future. I also described in detail that improvement was not guaranteed.  The patient expressed understanding of these risks, and asked that we proceed with surgery. I described the surgery in layman's terms, and gave ample opportunity for questions, which were answered to the best of my ability.  PRECAUTIONS: Do not lift >10 lbs   SUBJECTIVE: Pt reports that she experienced  increased pain after lifting dirt bags at work to the point of NPS 8/10.   PAIN:  Are you having pain? Yes, 3/10 on medial surface of right shoulder blade    OBJECTIVE:    VITALS: BP 109/74 HR 84 SpO2 100   DIAGNOSTIC FINDINGS:  CLINICAL DATA:  Cervicalgia M54.2 (ICD-10-CM)   EXAM: MRI CERVICAL SPINE WITHOUT CONTRAST   TECHNIQUE: Multiplanar, multisequence MR imaging of the cervical spine was performed. No  intravenous contrast was administered.   COMPARISON:  MRI of the cervical spine 02/29/2020.   FINDINGS: Alignment: Similar alignment. Similar straightening with trace retrolisthesis of C7 on T1. Similar trace anterolisthesis of C4 on C5.   Vertebrae: C6-C7 ACDF with solid bony fusion. No focal marrow edema chest acute fracture, discitis/osteomyelitis, or suspicious bone lesion.   Cord: Normal cord signal.   Posterior Fossa, vertebral arteries, paraspinal tissues: Visualized vertebral artery flow voids are maintained. Unremarkable paraspinal soft tissues without evidence of edema. No evidence of acute abnormality in the visualized posterior fossa on limited assessment.   Disc levels:   C2-C3: Mild uncovertebral hypertrophy with bilateral facet arthropathy. No significant stenosis.   C3-C4: Posterior disc osteophyte complex, eccentric to the left with left greater than right facet and uncovertebral hypertrophy. Resulting similar versus mildly progressed moderate left foraminal stenosis with potential left C4 nerve impingement. Similar mild right foraminal stenosis and mild canal stenosis.   C4-C5: Posterior disc osteophyte complex with small central disc protrusion which contacts and flattens the ventral cord. Central disc protrusion is mildly progressed with overall similar mild canal stenosis. Similar mild left foraminal stenosis with patent right foramina.   C5-C6: Left eccentric posterior disc osteophyte complex with left greater than right facet and uncovertebral hypertrophy. Similar resulting moderate to severe spinal canal stenosis with flattening of the cord. Similar severe left greater than right foraminal stenosis.   C6-C7: Prior ACDF with patent canal. At least mild bilateral foraminal stenosis due to spurring, similar.   C7-T1: Left eccentric posterior disc osteophyte complex with left greater than right facet and uncovertebral hypertrophy. Similar severe  left and mild-to-moderate right foraminal stenosis with potential impingement of the exiting left C8 nerve. Similar mild canal stenosis.   IMPRESSION: 1. At C5-C6, similar moderate to severe canal stenosis and severe bilateral neural foraminal stenosis. 2. At C7-T1, similar severe left foraminal stenosis. 3. At C3-C4, similar versus mildly progressed moderate left foraminal stenosis. 4. Additional milder multilevel degenerative change is detailed above.     Electronically Signed   By: Margaretha Sheffield M.D.   On: 05/02/2021 09:53   CLINICAL DATA:  Fluoroscopic assistance for cervical spine fusion   EXAM: CERVICAL SPINE - 2-3 VIEW   COMPARISON:  None.   FINDINGS: Fluoroscopic images show interval anterior fusion at C5-C6 level. There is previous anterior fusion at C6-C7 level. Fluoroscopic time was 9 seconds. Estimated radiation dose is 3.27 mGy.   IMPRESSION: Fluoroscopic assistance was provided for cervical spinal fusion at C5-C6 level.     Electronically Signed   By: Elmer Picker M.D.   On: 05/14/2021 10:51   PATIENT SURVEYS:  FOTO 40     COGNITION: Overall cognitive status: Within functional limits for tasks assessed     SENSATION: Light touch: Impaired numbness in tingling in posterior side of hand and along posterior side of elbow and forearm along radial nerve distribution.    POSTURE:  Forward head and rounded shoulders  CERVICAL ROM:    Active ROM A/PROM (deg) 06/07/2021  Flexion 30  Extension 30  Right lateral flexion 30  Left lateral flexion 30  Right rotation 30  Left rotation 30   (Blank rows = not tested)   UE ROM:   AROM + PROM ROM Right 06/07/2021 Left 06/07/2021  Shoulder flexion      Shoulder extension 180 120  Shoulder abduction 180 120  Shoulder adduction      Shoulder extension 60 60  Shoulder internal rotation      Shoulder external rotation      Elbow flexion      Elbow extension      Wrist  flexion 0 70  Wrist extension 0 80  Wrist ulnar deviation      Wrist radial deviation      Wrist pronation      Wrist supination       (Blank rows = not tested)   UE MMT:   MMT Right 06/07/2021 Left 06/07/2021  Shoulder flexion 5/5 4/5  Shoulder extension      Shoulder abduction 5/5 5/5  Shoulder adduction      Shoulder extension      Shoulder internal rotation      Shoulder external rotation      Middle trapezius      Lower trapezius      Elbow flexion 5/5 5/5  Elbow extension 5/5 3+/5  Wrist flexion NT 5/5  Wrist extension NT 2+/5  Wrist ulnar deviation      Wrist radial deviation      Wrist pronation      Wrist supination      Grip strength       (Blank rows = not tested)                PATIENT SURVEYS:  FOTO 40/61   TODAY'S TREATMENT:  08/07/21  THEREX   UBE Seat 11 and resistance 3 forward 3 min and backward 3 min  OMEGA Seated Rows #20 3 x 10  - min VC to pinch shoulder blades and to keep shoulders from elevating  Scapular Retraction with Blue TB 3 x 10   MANUAL  Trigger Point Release of medial border of left shoulder blade musculature  08/02/21  THEREX   UBE Seat 13, 3 min forward and 3 min backward at level 3 resistance  Shoulder AROM  -Flexion R/L 180/150 -Abduction R/L 180/150   Shoulder Flexion with #1 DB 3 x 10  Shoulder Abduction with # 1 DB 3 x 10    07/24/21 Cervical ROM, activation -UBE Seat 12, 10 - 4 minutes, level 3-2, alternating fwd/backward Q60sec    -seated cervical rotation A/ROM 20x bilat (lt limited >Rt)  -seated cervical extension 1x15x5secH  -capital flexion stretch 3x30sec    -standing bilat shoulder ABDCT to 90 x15 -seated cable rowdown 20lb 1x15 -standing bilat shoulder ABDCT to 90 x15 -seated cable rowdown 20lb 1x15      PATIENT EDUCATION:  Education details: form and technique for appropriate exercise. Explanation about rehab potential to regain triceps function.  Person educated: Patient Education  method: Explanation, Demonstration, Verbal cues, and Handouts Education comprehension: verbalized understanding, returned demonstration, and verbal cues required     HOME EXERCISE PROGRAM: Access Code: [email or web address] URL: [email or web address] Date: 08/02/2021 Prepared by: Bradly Chris  Exercises - Seated Shoulder Flexion AAROM with Pulley Behind  - 1 x daily - 7 x weekly - 3 sets - 10 reps - Seated Shoulder Flexion AAROM  with Pulley Behind  - 1 x daily - 7 x weekly - 3 sets - 10 reps - Standing Isometric Shoulder Extension with Doorway - Arm Bent  - 1 x daily - 3 x weekly - 3 sets - 10 reps - 3 hold - Shoulder External Rotation with Anchored Resistance  - 1 x daily - 3 x weekly - 3 sets - 10 reps - Shoulder Internal Rotation with Resistance  - 1 x daily - 3 x weekly - 3 sets - 10 reps - Standing Shoulder Flexion with Resistance  - 1 x daily - 3 x weekly - 3 sets - 10 reps - Shoulder Abduction with Dumbbells - Thumbs Up  - 1 x daily - 3 x weekly - 3 sets - 10 reps ASSESSMENT:   CLINICAL IMPRESSION:   Pt presents to session with increased left parascapular pain likely due to compensation for having to lift heavy bag of dirt. She did report decreased muscular tension after trigger point release with palpable tension along medial border of left scapulae. Focused on strengthening weak parascapular muscular with seated scapular retraction. She did not some increased pain in medial border of left shoulder blade after performing seated rows. Reviewed exercises on HEP and discussed reducing frequency to avoid using remainder of appointments before there is full neuromuscular return of triceps which continues to be flaccid with overhead reaching of LUE.       OBJECTIVE IMPAIRMENTS decreased ROM, decreased strength, and impaired UE functional use.    ACTIVITY LIMITATIONS cleaning, occupation, and laundry.    PERSONAL FACTORS Past/current experiences are also affecting patient's  functional outcome.      REHAB POTENTIAL: Fair unclear of how much motor control of left triceps can be regained through PT   CLINICAL DECISION MAKING: Evolving/moderate complexity   EVALUATION COMPLEXITY: Moderate     GOALS: Goals reviewed with patient? Yes   SHORT TERM GOALS: Target date: 06/21/2021   Pt will be independent with HEP in order to improve strength and balance in order to decrease fall risk and improve function at home and work.   Baseline: 08/02/21 Able to perform but only with enough time  Goal status: PARTIALLY ACHIEVED    Pt will demonstrate understanding of self mobilization of left shoulder and elbow joint to reduce risk of frozen joints.    Baseline: 08/02/21: Able to perform independently  Goal status: ACHIEVED        LONG TERM GOALS: Target date: 08/02/2021   Patient will have improved function and activity level as evidenced by an increase in FOTO score by 10 points or more.  Baseline: 40/61 08/02/21: 60/61 Goal status: ACHIEVED    2.  Patient will improve left shoulder AROM to be within 10% of that of right shoulder for flexion and abduction to perform overhead reaching for job related tasks.  Baseline: Shoulder Flex R/L 180/120, Shoulder Abduction 180/120 08/02/21: Shoulder Flex R/L 180/150 Shoulder Abd R/L 180/150  Goal status: PARTIALLY ACHIEVED        PLAN: PT FREQUENCY: 1-2x/week   PT DURATION: 8 weeks   PLANNED INTERVENTIONS: Therapeutic exercises, Therapeutic activity, Neuromuscular re-education, Balance training, Gait training, Patient/Family education, Joint manipulation, Joint mobilization, Aquatic Therapy, Dry Needling, Electrical stimulation, Moist heat, and Manual therapy   PLAN FOR NEXT SESSION:   Reassess goals. Continue with parascapular and left shoulder strengthening within available ROM. Sidelying IR and ER     Ellin Goodie PT, DPT  Johnn Hai, PT 08/07/2021, 5:24 PM

## 2021-08-09 ENCOUNTER — Encounter: Payer: BC Managed Care – PPO | Admitting: Physical Therapy

## 2021-08-14 ENCOUNTER — Ambulatory Visit: Payer: BC Managed Care – PPO | Admitting: Occupational Therapy

## 2021-08-15 ENCOUNTER — Ambulatory Visit: Payer: BC Managed Care – PPO | Admitting: Physical Therapy

## 2021-08-16 ENCOUNTER — Ambulatory Visit: Payer: BC Managed Care – PPO | Attending: Neurosurgery | Admitting: Occupational Therapy

## 2021-08-16 DIAGNOSIS — M6281 Muscle weakness (generalized): Secondary | ICD-10-CM | POA: Diagnosis present

## 2021-08-16 DIAGNOSIS — M25612 Stiffness of left shoulder, not elsewhere classified: Secondary | ICD-10-CM | POA: Insufficient documentation

## 2021-08-16 DIAGNOSIS — M25632 Stiffness of left wrist, not elsewhere classified: Secondary | ICD-10-CM | POA: Diagnosis present

## 2021-08-16 DIAGNOSIS — M25642 Stiffness of left hand, not elsewhere classified: Secondary | ICD-10-CM | POA: Diagnosis present

## 2021-08-16 DIAGNOSIS — M542 Cervicalgia: Secondary | ICD-10-CM | POA: Insufficient documentation

## 2021-08-16 NOTE — Therapy (Signed)
[facility] [address], [place], [address] Phone: [phone]   Fax:  [phone]  Occupational Therapy Treatment  Patient Details  Name: Lori Calhoun MRN: [id number] Date of Birth: Dec 15, 1970 Referring Provider (OT): Myer Haff   Encounter Date: 08/16/2021   OT End of Session - 08/16/21 1917     Visit Number 7    Number of Visits 8    Date for OT Re-Evaluation 09/10/21    OT Start Time 1700    OT Stop Time 1742    OT Time Calculation (min) 42 min    Activity Tolerance Patient tolerated treatment well    Behavior During Therapy [facility] for tasks assessed/performed             Past Medical History:  Diagnosis Date   Diabetes mellitus, type 2 (HCC)    Hypertension    Peripheral neuropathy    feet    Past Surgical History:  Procedure Laterality Date   adnoids  Bilateral 1979   AMPUTATION TOE Right 04/15/2018   Procedure: RAY RIGHT 5TH;  Surgeon: Gwyneth Revels, DPM;  Location: [facility];  Service: Podiatry;  Laterality: Right;  general with local Diabetic - oral meds   ANTERIOR CERVICAL DECOMP/DISCECTOMY FUSION N/A 05/14/2021   Procedure: C5-6 ANTERIOR CERVICAL DISCECTOMY AND FUSION (GLOBUS HEDRON);  Surgeon: Venetia Night, MD;  Location: [facility];  Service: Neurosurgery;  Laterality: N/A;   CERVICAL SPINE SURGERY     plate at O9-G2   LAPAROSCOPIC SUPRACERVICAL HYSTERECTOMY  2014   right wrist surgery  2022    There were no vitals filed for this visit.   Subjective Assessment - 08/16/21 1916     Subjective  I done the exercises but I think to compensate a lot because at work I have to always work above shoulder height he cannot support my elbow.  My neck or my shoulder blade always hurts by the end of the day and heating pad feels really good.  I cannot really see always progress but you pointed out to me every time I see you.    Pertinent History 05/03/21 Per Dr. Lucienne Capers Note    Assessment and Plan:  Lori Calhoun is a pleasant 51 y.o. female with cervical myelopathy due to cervical stenosis at C5-6 from adjacent segment disease. She has worsening symptoms over the past 2 months. She has objective findings on exam including weakness of her left arm as well as hyperreflexia. She is unable to do tandem walk and has loss of balance.    There is no role for conservative management and cervical myelopathy. I recommended surgical intervention with a C5-6 anterior cervical discectomy and fusion. We will send her for vocal cord clearance.    I discussed the planned procedure at length with the patient, including the risks, benefits, alternatives, and indications. The risks discussed include but are not limited to bleeding, infection, need for reoperation, spinal fluid leak, stroke, vision loss, anesthetic complication, coma, paralysis, and even death. We also discussed the possibility of post-operative dysphagia, vocal cord paralysis, and the risk of adjacent segment disease in the future. I also described in detail that improvement was not guaranteed.    The patient expressed understanding of these risks, and asked that we proceed with surgery. I described the surgery in layman's terms, and gave ample opportunity    Currently in Pain? No/denies                [facility]  OT Assessment - 08/16/21 0001       Strength   Left Hand Grip (lbs) 15   support wrist 32   Left Hand Lateral Pinch 5 lbs    Left Hand 3 Point Pinch 6 lbs   support wrist 10           Grip and prehension increase - pt able to stabilize wrist much better with gripping and pinching today    TODAY'S TREATMENT: Reassess patient's strength at wrist and digits for upgrade of home exercise program   Patient this date able to do 2 pound weight, dumbbell for supination pronation 2 sets of 10-12 able to maintain neutral and not dropping into flexion.  Also able to cont with  2 pound weight for ulnar and radial deviation  over table able to maintain neutral 2 sets of 12 increase to .  Upon assessment patient was able to do do dumbbell 1 pound weight wrist extension placed on hold 2 sets of 6 BUT upgrade to 2 lbs for 2x 12 horizontal  AROM.     Weightbearing in between -    Patient to continue with the same thumb home exercises as well's coordination fine motor.  Sliding thumb radial abduction on paper with 3/4 motion- and then rolling over thumb and palm from palm down to neutral while maintaining extention 8 reps - 12 reps-continue same home program; Thumb RA able to add rubber band today 2x 12 reps  Pt to  do light weightbearing through palm to stretch flexors of forearm and hand prior to any wrist  and especially digits extension home exercises.  Pt able this date to do ABD and ADD of digits and Opposition of 5th to thumb    Cont with digit extension to patient to pick up hand and maintaining digit extension off table with fingers and abduction.  Able to do 2 sets of 8.  Then large container like oatmeal container patient able to Income off into extension 2 sets of 8.   Smaller container of 10 inch cylinder cupping digit and coming off and extension able to do 2 sets of 5 reps.   Patient this date better with maintaining fifth digit extension same height than second digit of table and large container.  Second digit was at [facility] 0 degrees , 3rd -40, 4th weakest and 5th MC -20 at  start of session  Patient patient can do a lumbrical fist and if block she can do intrinsic a fist with PIP extension.   Can cont to use a wrist brace at night or during heavy activity to stabilize wrist. Did fit patient with a Benik neoprene to use during home exercises to cue wrist to stay neutral without being dependent on the hard brace or no brace. Cont with light blue for gripping but stopped before wrist collapses into flexion 2 sets of 8, ; lateral pinch and three-point pinch 2 sets of 8. Use neoprene wrist Benik with all of them    opposition picking up 1 or 2 cm foam block alternating digits.  I have a hard time extending fourth digit during opposition to fifth can use 4th and 5th together - focus on extending digits if not picking up. Then sticky golf ball opposition from palm to  all digits  and then to do 4th/5th digit <> palm 30 sec x 5 . Admit did not do golf ball opposition as much         PATIENT EDUCATION: Education details:  progress, nerve healing and protocol for rehab, home exercises Person educated: Patient was educated  education method: Explanation, Demonstration, Tactile cues, Verbal cues, and Handouts Education comprehension: verbalized understanding, returned demonstration, verbal cues required, and needs further education       GOALS: Goals reviewed with patient? Yes   SHORT TERM GOALS: Target date: 07/16/2021   Patient be independent to decrease flexor tone and show increase  wrist extension and digit extension Baseline: Wrist extension partial range has to do placed on hold, able to tap second digit unable to do second and third, fifth initiation of extension but unable to do place and hold Goal status: INITIAL LONG TERM GOALS: Target date: You   Left wrist and forearm strength increased to 4+/5 10 doorknob, hold drink an open jar without wrist collapsing Baseline: Decreased strength in pronation more than supination, wrist extension and ulnar deviation being 3-3 minus/5 strength-compensating with abduction of elbow external rotation Goal status: INITIAL   2.  Left digits extension increased for patient to release large cylinder up check. Baseline: Unable to extend second and third digits, trace of extension on fifth able to tap the second of table after light weightbearing through palm Goal status: INITIAL   3.  Left thumb radial abduction and opposition increase for patient to be able to do rubber band for ponytail-if needed can simulate Baseline: Patient unable to to show extension of  third and fourth digits, trace of fifth had to cut her hair unable to do ponytail because of decreased extension of digits and thumb radial abduction Goal status: INITIAL   4.  Left wrist extension increase for patient to squeeze toothpaste squeeze washcloth and do buttons without collapsing into flexion Baseline: Patient unable to stabilize neutral wrist during any gripping or pinches for ADLs Goal status: INITIAL   5.  Assist grip and prehension strength when patient able to stabilize wrist better Baseline: Patient collapsed into flexion at wrist with any grip and prehension. Goal status: INITIAL ASSESSMENT:   CLINICAL IMPRESSION: Patient presented OT evaluation postop anterior fusion of C5-C6.  Patient referred to OT for decreased forearm wrist and hand weakness. Patient this date icontinuing to show improvement in wrist extension and stabilization.  Was able to upgrade patient to 2 pound cylinder weight for  all wrist and forearm exercises but wrist extention doing horizontal. Still very important for patient to do weightbearing to decrease flexors tightness during home exercises .  Patient with tightness in flexors of hand, wrist and forearms.  Did better after light stretches.  Patient showed this date increase  2nd and 5th  digit  MC extension coming off table to maintain as well as coming off large cylinder.  Second digit 0 degrees , [facility] -40 [facility] the [facility].  Patient continue with using  light blue putty for gripping, lateral and three-point pinch using light blue neoprene wrist brace for prevention at home .  Patient to do weightbearing in between 2 sets of 8.  Pt show increase wrist stabilization during gripping and prehension strength this dtae . Patient to continue with same home program opposition palm <> finger palm tips opposition with golf ball.  Patient patient limited in ADLs and IADLs requiring digit and thumb extension, wrist extension, ulnar deviation  and pronation.  Patient can benefit from skilled OT services to increase strength decrease stiffness increase functional use of left hand in ADLs and IADLs.   PERFORMANCE DEFICITS in functional skills including ADLs, IADLs,  coordination, dexterity, ROM, strength, flexibility, decreased knowledge of precautions, and UE functional use,  IMPAIRMENTS are limiting patient from ADLs, IADLs, work, play, and leisure.    COMORBIDITIES has no other co-morbidities that affects occupational performance. Patient will benefit from skilled OT to address above impairments and improve overall function.   MODIFICATION OR ASSISTANCE TO COMPLETE EVALUATION: No modification of tasks or assist necessary to complete an evaluation.   OT OCCUPATIONAL PROFILE AND HISTORY: Detailed assessment: Review of records and additional review of physical, cognitive, psychosocial history related to current functional performance.   CLINICAL DECISION MAKING: Moderate - several treatment options, min-mod task modification necessary   REHAB POTENTIAL: Good   EVALUATION COMPLEXITY: Moderate         PLAN: OT FREQUENCY: 1x/week   OT DURATION: 12 weeks   PLANNED INTERVENTIONS: self care/ADL training, therapeutic exercise, neuromuscular re-education, manual therapy, passive range of motion, splinting, patient/family education, and DME and/or AE instructions     CONSULTED AND AGREED WITH PLAN OF CARE: Patient   PLAN FOR NEXT SESSION: Reassess range of motion and strength in left upper extremity, adjust and upgrade home exercise as needed and reviewed with                                                             Skin great                        Patient will benefit from skilled therapeutic intervention in order to improve the following deficits and impairments:       Visit Diagnosis: Muscle weakness (generalized)   Stiffness of left wrist, not elsewhere classified   Stiffness of  left hand, not elsewhere classified                     OT Education - 08/16/21 1917     Education Details Changes to home program and progress    Person(s) Educated Patient    Methods Explanation;Demonstration;Tactile cues;Verbal cues;Handout    Comprehension Verbal cues required;Returned demonstration;Verbalized understanding                         Patient will benefit from skilled therapeutic intervention in order to improve the following deficits and impairments:           Visit Diagnosis: Muscle weakness (generalized)  Stiffness of left wrist, not elsewhere classified  Stiffness of left hand, not elsewhere classified    Problem List Patient Active Problem List   Diagnosis Date Noted   Benzodiazepine overdose 11/04/2016   Adjustment disorder with mixed disturbance of emotions and conduct 11/04/2016   Marijuana abuse 11/04/2016    Oletta Cohn, OTR/L,CLT 08/16/2021, 7:18 PM   [facility] [address] [place], [address] Phone: [phone]   Fax:  [phone]  Name: Lori Calhoun MRN: [id number] Date of Birth: 29-Apr-1970

## 2021-08-20 ENCOUNTER — Encounter: Payer: Self-pay | Admitting: Physical Therapy

## 2021-08-20 ENCOUNTER — Ambulatory Visit: Payer: BC Managed Care – PPO | Admitting: Physical Therapy

## 2021-08-20 ENCOUNTER — Encounter: Payer: BC Managed Care – PPO | Admitting: Physical Therapy

## 2021-08-20 DIAGNOSIS — M25612 Stiffness of left shoulder, not elsewhere classified: Secondary | ICD-10-CM

## 2021-08-20 DIAGNOSIS — M6281 Muscle weakness (generalized): Secondary | ICD-10-CM | POA: Diagnosis not present

## 2021-08-20 DIAGNOSIS — M542 Cervicalgia: Secondary | ICD-10-CM

## 2021-08-20 NOTE — Therapy (Signed)
OUTPATIENT PHYSICAL THERAPY PROGRESS NOTE   Patient Name: Lori Calhoun MRN: [id number] DOB:1970/12/23, 51 y.o., female Today's Date: 08/20/2021  PCP: Enid Baas, MD REFERRING PROVIDER: Venetia Night, MD   PT End of Session - 08/20/21 1635     Visit Number 10    Number of Visits 16    Date for PT Re-Evaluation 08/02/21    Authorization Type [employer] COMM pro- 20 VL per calendar year    Authorization Time Period 08/03/21-10/03/21    Authorization - Visit Number 10    Authorization - Number of Visits 16    Progress Note Due on Visit 10    PT Start Time 1630    PT Stop Time 1715    PT Time Calculation (min) 45 min    Activity Tolerance Patient tolerated treatment well;No increased pain    Behavior During Therapy WFL for tasks assessed/performed              Past Medical History:  Diagnosis Date   Diabetes mellitus, type 2 (HCC)    Hypertension    Peripheral neuropathy    feet   Past Surgical History:  Procedure Laterality Date   adnoids  Bilateral 1979   AMPUTATION TOE Right 04/15/2018   Procedure: RAY RIGHT 5TH;  Surgeon: Gwyneth Revels, DPM;  Location: [facility];  Service: Podiatry;  Laterality: Right;  general with local Diabetic - oral meds   ANTERIOR CERVICAL DECOMP/DISCECTOMY FUSION N/A 05/14/2021   Procedure: C5-6 ANTERIOR CERVICAL DISCECTOMY AND FUSION (GLOBUS HEDRON);  Surgeon: Venetia Night, MD;  Location: [facility];  Service: Neurosurgery;  Laterality: N/A;   CERVICAL SPINE SURGERY     plate at F6-B8   LAPAROSCOPIC SUPRACERVICAL HYSTERECTOMY  2014   right wrist surgery  2022   Patient Active Problem List   Diagnosis Date Noted   Benzodiazepine overdose 11/04/2016   Adjustment disorder with mixed disturbance of emotions and conduct 11/04/2016   Marijuana abuse 11/04/2016    REFERRING DIAG: Weakness of Left  elbow extensor and Left Shoulder Stiffness   THERAPY DIAG:  Cervicalgia  Stiffness of left shoulder, not elsewhere  classified  PERTINENT HISTORY: 05/03/21 Per Dr. Lucienne Capers Note  Assessment and Plan: Ms. Lampkins is a pleasant 51 y.o. female with cervical myelopathy due to cervical stenosis at C5-6 from adjacent segment disease. She has worsening symptoms over the past 2 months. She has objective findings on exam including weakness of her left arm as well as hyperreflexia. She is unable to do tandem walk and has loss of balance.  There is no role for conservative management and cervical myelopathy. I recommended surgical intervention with a C5-6 anterior cervical discectomy and fusion. We will send her for vocal cord clearance.  I discussed the planned procedure at length with the patient, including the risks, benefits, alternatives, and indications. The risks discussed include but are not limited to bleeding, infection, need for reoperation, spinal fluid leak, stroke, vision loss, anesthetic complication, coma, paralysis, and even death. We also discussed the possibility of post-operative dysphagia, vocal cord paralysis, and the risk of adjacent segment disease in the future. I also described in detail that improvement was not guaranteed.  The patient expressed understanding of these risks, and asked that we proceed with surgery. I described the surgery in layman's terms, and gave ample opportunity for questions, which were answered to the best of my ability.  PRECAUTIONS: Do not lift >10 lbs   SUBJECTIVE: Pt states she is still continuing not to be able to use  left arm to torque wrench and control it while reaching above 90 degrees.   PAIN:  Are you having pain? No pain    OBJECTIVE:    VITALS: BP 109/74 HR 84 SpO2 100   DIAGNOSTIC FINDINGS:  CLINICAL DATA:  Cervicalgia M54.2 (ICD-10-CM)   EXAM: MRI CERVICAL SPINE WITHOUT CONTRAST   TECHNIQUE: Multiplanar, multisequence MR imaging of the cervical spine was performed. No intravenous contrast was administered.   COMPARISON:  MRI of the cervical  spine 02/29/2020.   FINDINGS: Alignment: Similar alignment. Similar straightening with trace retrolisthesis of C7 on T1. Similar trace anterolisthesis of C4 on C5.   Vertebrae: C6-C7 ACDF with solid bony fusion. No focal marrow edema chest acute fracture, discitis/osteomyelitis, or suspicious bone lesion.   Cord: Normal cord signal.   Posterior Fossa, vertebral arteries, paraspinal tissues: Visualized vertebral artery flow voids are maintained. Unremarkable paraspinal soft tissues without evidence of edema. No evidence of acute abnormality in the visualized posterior fossa on limited assessment.   Disc levels:   C2-C3: Mild uncovertebral hypertrophy with bilateral facet arthropathy. No significant stenosis.   C3-C4: Posterior disc osteophyte complex, eccentric to the left with left greater than right facet and uncovertebral hypertrophy. Resulting similar versus mildly progressed moderate left foraminal stenosis with potential left C4 nerve impingement. Similar mild right foraminal stenosis and mild canal stenosis.   C4-C5: Posterior disc osteophyte complex with small central disc protrusion which contacts and flattens the ventral cord. Central disc protrusion is mildly progressed with overall similar mild canal stenosis. Similar mild left foraminal stenosis with patent right foramina.   C5-C6: Left eccentric posterior disc osteophyte complex with left greater than right facet and uncovertebral hypertrophy. Similar resulting moderate to severe spinal canal stenosis with flattening of the cord. Similar severe left greater than right foraminal stenosis.   C6-C7: Prior ACDF with patent canal. At least mild bilateral foraminal stenosis due to spurring, similar.   C7-T1: Left eccentric posterior disc osteophyte complex with left greater than right facet and uncovertebral hypertrophy. Similar severe left and mild-to-moderate right foraminal stenosis with potential impingement  of the exiting left C8 nerve. Similar mild canal stenosis.   IMPRESSION: 1. At C5-C6, similar moderate to severe canal stenosis and severe bilateral neural foraminal stenosis. 2. At C7-T1, similar severe left foraminal stenosis. 3. At C3-C4, similar versus mildly progressed moderate left foraminal stenosis. 4. Additional milder multilevel degenerative change is detailed above.     Electronically Signed   By: Feliberto HartsFrederick S Jones M.D.   On: 05/02/2021 09:53   CLINICAL DATA:  Fluoroscopic assistance for cervical spine fusion   EXAM: CERVICAL SPINE - 2-3 VIEW   COMPARISON:  None.   FINDINGS: Fluoroscopic images show interval anterior fusion at C5-C6 level. There is previous anterior fusion at C6-C7 level. Fluoroscopic time was 9 seconds. Estimated radiation dose is 3.27 mGy.   IMPRESSION: Fluoroscopic assistance was provided for cervical spinal fusion at C5-C6 level.     Electronically Signed   By: Ernie AvenaPalani  Rathinasamy M.D.   On: 05/14/2021 10:51   PATIENT SURVEYS:  FOTO 40     COGNITION: Overall cognitive status: Within functional limits for tasks assessed     SENSATION: Light touch: Impaired numbness in tingling in posterior side of hand and along posterior side of elbow and forearm along radial nerve distribution.    POSTURE:  Forward head and rounded shoulders                  CERVICAL ROM:  Active ROM A/PROM (deg) 06/07/2021  Flexion 30  Extension 30  Right lateral flexion 30  Left lateral flexion 30  Right rotation 30  Left rotation 30   (Blank rows = not tested)   UE ROM:   AROM + PROM ROM Right 06/07/2021 Left 06/07/2021  Shoulder flexion      Shoulder extension 180 120  Shoulder abduction 180 120  Shoulder adduction      Shoulder extension 60 60  Shoulder internal rotation      Shoulder external rotation      Elbow flexion      Elbow extension      Wrist flexion 0 70  Wrist extension 0 80  Wrist ulnar deviation      Wrist radial  deviation      Wrist pronation      Wrist supination       (Blank rows = not tested)   UE MMT:   MMT Right 06/07/2021 Left 06/07/2021  Shoulder flexion 5/5 4/5  Shoulder extension      Shoulder abduction 5/5 5/5  Shoulder adduction      Shoulder extension      Shoulder internal rotation      Shoulder external rotation      Middle trapezius      Lower trapezius      Elbow flexion 5/5 5/5  Elbow extension 5/5 3+/5  Wrist flexion NT 5/5  Wrist extension NT 2+/5  Wrist ulnar deviation      Wrist radial deviation      Wrist pronation      Wrist supination      Grip strength       (Blank rows = not tested)                PATIENT SURVEYS:  FOTO 40/61   TODAY'S TREATMENT:  08/20/21  THEREX   UBE Seat level 12 - 5 min    MMT  Elbow Ext R/L 5/4   Elbow Extension Isometric 3 x 10 with 5 sec hold   Left Shoulder Abduction towel slides 3 x 10    Left Shoulder Flexion towel slides 3 x 10      08/07/21  THEREX   UBE Seat 11 and resistance 3 forward 3 min and backward 3 min  OMEGA Seated Rows #20 3 x 10  - min VC to pinch shoulder blades and to keep shoulders from elevating  Scapular Retraction with Blue TB 3 x 10   MANUAL  Trigger Point Release of medial border of left shoulder blade musculature  08/02/21  THEREX   UBE Seat 13, 3 min forward and 3 min backward at level 3 resistance  Shoulder AROM  -Flexion R/L 180/150 -Abduction R/L 180/150   Shoulder Flexion with #1 DB 3 x 10  Shoulder Abduction with # 1 DB 3 x 10    07/24/21 Cervical ROM, activation -UBE Seat 12, 10 - 4 minutes, level 3-2, alternating fwd/backward Q60sec    -seated cervical rotation A/ROM 20x bilat (lt limited >Rt)  -seated cervical extension 1x15x5secH  -capital flexion stretch 3x30sec    -standing bilat shoulder ABDCT to 90 x15 -seated cable rowdown 20lb 1x15 -standing bilat shoulder ABDCT to 90 x15 -seated cable rowdown 20lb 1x15      PATIENT EDUCATION:  Education  details: form and technique for appropriate exercise. Explanation about rehab potential to regain triceps function.  Person educated: Patient Education method: Explanation, Demonstration, Verbal cues, and Handouts Education comprehension: verbalized understanding,  returned demonstration, and verbal cues required     HOME EXERCISE PROGRAM: Access Code: [email or web address] URL: [email or web address] Date: 08/20/2021 Prepared by: Ellin Goodie  Exercises - Standing Shoulder Abduction Slides at Wall  - 1 x daily - 7 x weekly - 3 sets - 10 reps - Shoulder Flexion Wall Slide with Towel  - 1 x daily - 7 x weekly - 3 sets - 10 reps - Shoulder External Rotation with Anchored Resistance  - 1 x daily - 3 x weekly - 3 sets - 10 reps - Shoulder Internal Rotation with Resistance  - 1 x daily - 3 x weekly - 3 sets - 10 reps - Standing Shoulder Flexion with Resistance  - 1 x daily - 3 x weekly - 3 sets - 10 reps - Shoulder Abduction with Dumbbells - Thumbs Up  - 1 x daily - 3 x weekly - 3 sets - 10 reps - Scapular Retraction with Resistance  - 1 x daily - 3 x weekly - 3 sets - 10 reps - Seated Isometric Elbow Extension  - 1 x daily - 3 x weekly - 3 sets - 10 reps - 5 hold    ASSESSMENT:   CLINICAL IMPRESSION:   Pt continues to demonstrate similar left shoulder ROM as two weeks ago and difficulty activating triceps muscle especially with overhead activity above 90 degrees. She will be seeing neurologist tomorrow for further evaluation and treatment of underlying neurological and plan of care will be based of physician's report given that tricep return is one of the last remaining deficits to be addressed. Pt is in agreement with plan and she will call office to cancel appointments if neurological return of triceps is limited. Otherwise, pt will return to continue to work on increased shoulder ROM and triceps strength. Provided with robust HEP if she is to discharge to continue exercises independently.        OBJECTIVE IMPAIRMENTS decreased ROM, decreased strength, and impaired UE functional use.    ACTIVITY LIMITATIONS cleaning, occupation, and laundry.    PERSONAL FACTORS Past/current experiences are also affecting patient's functional outcome.      REHAB POTENTIAL: Fair unclear of how much motor control of left triceps can be regained through PT   CLINICAL DECISION MAKING: Evolving/moderate complexity   EVALUATION COMPLEXITY: Moderate     GOALS: Goals reviewed with patient? Yes   SHORT TERM GOALS: Target date: 06/21/2021   Pt will be independent with HEP in order to improve strength and balance in order to decrease fall risk and improve function at home and work.   Baseline: 08/02/21 Able to perform but only with enough time 08/20/21: Performed HEP independently  Goal status: ACHIEVED    Pt will demonstrate understanding of self mobilization of left shoulder and elbow joint to reduce risk of frozen joints.    Baseline: 08/02/21: Able to perform independently  Goal status: ACHIEVED        LONG TERM GOALS: Target date: 08/02/2021   Patient will have improved function and activity level as evidenced by an increase in FOTO score by 10 points or more.  Baseline: 40/61 08/02/21: 60/61 Goal status: ACHIEVED    2.  Patient will improve left shoulder AROM to be within 10% of that of right shoulder for flexion and abduction to perform overhead reaching for job related tasks.  Baseline: Shoulder Flex R/L 180/120, Shoulder Abduction 180/120 08/02/21: Shoulder Flex R/L 180/150 Shoulder Abd R/L 180/150 08/20/21: Shoulder Flex R/L 180/150 Shoulder Abd R/L 180/150 Goal  status: PARTIALLY ACHIEVED        PLAN: PT FREQUENCY: 1-2x/week   PT DURATION: 8 weeks   PLANNED INTERVENTIONS: Therapeutic exercises, Therapeutic activity, Neuromuscular re-education, Balance training, Gait training, Patient/Family education, Joint manipulation, Joint mobilization, Aquatic Therapy, Dry Needling,  Electrical stimulation, Moist heat, and Manual therapy   PLAN FOR NEXT SESSION:   Continue with parascapular and left shoulder strengthening within available ROM. Sidelying IR and ER     Ellin Goodie PT, DPT  Johnn Hai, PT 08/20/2021, 4:35 PM

## 2021-08-27 ENCOUNTER — Ambulatory Visit: Payer: BC Managed Care – PPO | Admitting: Occupational Therapy

## 2021-08-29 ENCOUNTER — Encounter: Payer: BC Managed Care – PPO | Admitting: Physical Therapy

## 2021-08-30 ENCOUNTER — Encounter: Payer: BC Managed Care – PPO | Admitting: Occupational Therapy

## 2021-09-03 ENCOUNTER — Ambulatory Visit: Payer: BC Managed Care – PPO | Admitting: Occupational Therapy

## 2021-09-12 ENCOUNTER — Encounter: Payer: BC Managed Care – PPO | Admitting: Physical Therapy

## 2022-02-19 ENCOUNTER — Telehealth: Payer: Self-pay

## 2022-02-19 DIAGNOSIS — R29898 Other symptoms and signs involving the musculoskeletal system: Secondary | ICD-10-CM

## 2022-02-19 DIAGNOSIS — Z981 Arthrodesis status: Secondary | ICD-10-CM

## 2022-02-19 NOTE — Telephone Encounter (Signed)
-----   Message from Cristin E Ray sent at 02/19/2022 10:03 AM EST ----- Regarding: Nerve Study Had to move appt (12/7) out due to her records not being available in time. Needed to move it out to February 1 due to work schedule. Said Dr. Jeannie Fend had discussed ordering a nerve study and she wanted to know if we could go ahead and put that order in for her to have that done before her February appt.

## 2022-02-19 NOTE — Telephone Encounter (Signed)
Order placed for left upper extremity EMG. Please send to whichever place she prefers.

## 2022-02-19 NOTE — Telephone Encounter (Signed)
Left message to call back  

## 2022-02-21 ENCOUNTER — Ambulatory Visit: Payer: BC Managed Care – PPO | Admitting: Neurosurgery

## 2022-02-21 NOTE — Telephone Encounter (Signed)
Referral faxed to [facility] in [place]

## 2022-03-06 NOTE — Telephone Encounter (Signed)
Per [facility] she has not returned their call to schedule EMG.

## 2022-03-22 NOTE — Telephone Encounter (Signed)
Per Jenny Reichmann with [facility] they have not been able to contact the patient.

## 2022-04-17 ENCOUNTER — Other Ambulatory Visit: Payer: Self-pay

## 2022-04-17 DIAGNOSIS — Z981 Arthrodesis status: Secondary | ICD-10-CM

## 2022-04-18 ENCOUNTER — Ambulatory Visit
Admission: RE | Admit: 2022-04-18 | Discharge: 2022-04-18 | Disposition: A | Discharge status: Home / Self Care | Payer: BC Managed Care – PPO | Source: Ambulatory Visit | Attending: Neurosurgery | Admitting: Neurosurgery

## 2022-04-18 ENCOUNTER — Encounter: Payer: Self-pay | Admitting: Neurosurgery

## 2022-04-18 ENCOUNTER — Ambulatory Visit
Admission: RE | Admit: 2022-04-18 | Discharge: 2022-04-18 | Disposition: A | Discharge status: Home / Self Care | Payer: BC Managed Care – PPO | Attending: Neurosurgery | Admitting: Neurosurgery

## 2022-04-18 ENCOUNTER — Ambulatory Visit: Payer: BC Managed Care – PPO | Admitting: Neurosurgery

## 2022-04-18 VITALS — BP 130/74 | HR 79 | Ht 71.0 in | Wt 213.8 lb

## 2022-04-18 DIAGNOSIS — Z981 Arthrodesis status: Secondary | ICD-10-CM | POA: Insufficient documentation

## 2022-04-18 DIAGNOSIS — M47812 Spondylosis without myelopathy or radiculopathy, cervical region: Secondary | ICD-10-CM | POA: Diagnosis not present

## 2022-04-18 DIAGNOSIS — R29898 Other symptoms and signs involving the musculoskeletal system: Secondary | ICD-10-CM

## 2022-04-18 NOTE — Progress Notes (Signed)
    DOS: 05/14/21 C5-6 ACDF   HISTORY OF PRESENT ILLNESS: 04/18/2022 Ms. Lori Calhoun is status post ACDF.  She is doing well from her surgery, but still has some weakness in her left arm..   08/21/2021 Ms. Lori Calhoun is slightly better. Her pain is not limiting at this point, though she is taking gabapentin at nighttime. Her primary issue is still weakness below the left elbow and some in her left triceps.  06/26/2021 Lori Calhoun is status post ACDF. Overall, she is doing better.  She is having some shoulder discomfort and some difficulty lifting her left arm. She continues to have weakness in her left arm.  LOV 05/18/21 Dr. Beverly Sessions Calhoun is four days status post ACDF. Overall, she is doing better. She had a flare of pain in the first couple of days after surgery. We started her on a steroid taper which has helped significantly. She has noted that she has some left shoulder abduction weakness which was not present prior to surgery.    PHYSICAL EXAMINATION:  There were no vitals filed for this visit. General: Patient is well developed, well nourished, calm, collected, and in no apparent distress.  NEUROLOGICAL:  General: In no acute distress.  Awake, alert, oriented to person, place, and time. Pupils equal round and reactive to light.   Strength: Side Biceps Triceps Deltoid Interossei Grip Wrist Ext. Wrist Flex.  R 5 5 5 5 5  - -  L 5 5 5 3 3 3 5    Incision c/d/i   She has weakness in finger extension and fingers 3 through 5.  Imaging:  No complications noted.  She has extensive osteophytosis above her fusion.  Assessment / Plan: Lori Calhoun is doing well after C5-6 ACDF.  I am concerned that she may have a left-sided posterior interosseous nerve issue.  I would like to arrange an evaluation with a peripheral nerve specialist.   I will see her back as needed.  Meade Maw MD, [facility] Department of Neurosurgery

## 2022-11-21 ENCOUNTER — Other Ambulatory Visit: Payer: Self-pay | Admitting: Oncology

## 2022-11-21 DIAGNOSIS — Z006 Encounter for examination for normal comparison and control in clinical research program: Secondary | ICD-10-CM

## 2022-12-24 ENCOUNTER — Ambulatory Visit: Payer: BC Managed Care – PPO

## 2022-12-24 DIAGNOSIS — Z23 Encounter for immunization: Secondary | ICD-10-CM

## 2023-01-10 ENCOUNTER — Other Ambulatory Visit
Admission: RE | Admit: 2023-01-10 | Discharge: 2023-01-10 | Disposition: A | Discharge status: Home / Self Care | Payer: BC Managed Care – PPO | Source: Ambulatory Visit | Attending: Oncology | Admitting: Oncology

## 2023-01-10 DIAGNOSIS — Z006 Encounter for examination for normal comparison and control in clinical research program: Secondary | ICD-10-CM | POA: Insufficient documentation

## 2023-01-21 LAB — GENECONNECT MOLECULAR SCREEN

## 2023-01-21 LAB — HELIX MOLECULAR SCREEN: Genetic Analysis Overall Interpretation: NEGATIVE

## 2023-10-13 ENCOUNTER — Other Ambulatory Visit: Payer: Self-pay | Admitting: Gerontology

## 2023-10-13 DIAGNOSIS — Z1231 Encounter for screening mammogram for malignant neoplasm of breast: Secondary | ICD-10-CM

## 2023-12-23 ENCOUNTER — Ambulatory Visit

## 2023-12-23 DIAGNOSIS — Z23 Encounter for immunization: Secondary | ICD-10-CM

## 2024-03-26 ENCOUNTER — Ambulatory Visit
Admission: RE | Admit: 2024-03-26 | Discharge: 2024-03-26 | Disposition: A | Source: Ambulatory Visit | Attending: Gerontology

## 2024-03-26 DIAGNOSIS — Z1231 Encounter for screening mammogram for malignant neoplasm of breast: Secondary | ICD-10-CM | POA: Diagnosis present
